# Patient Record
Sex: Male | Born: 1975 | Race: Black or African American | Hispanic: No | Marital: Married | Smoking: Current every day smoker
Health system: Southern US, Community
[De-identification: ages and names within clinical notes are randomized; demographics above are authoritative.]

## PROBLEM LIST (undated history)

## (undated) HISTORY — PX: KNEE SURGERY: SHX244

---

## 2003-03-27 ENCOUNTER — Emergency Department (HOSPITAL_COMMUNITY): Admission: AD | Admit: 2003-03-27 | Discharge: 2003-03-27 | Payer: Self-pay | Admitting: Family Medicine

## 2003-08-10 ENCOUNTER — Emergency Department (HOSPITAL_COMMUNITY): Admission: EM | Admit: 2003-08-10 | Discharge: 2003-08-10 | Payer: Self-pay | Admitting: Family Medicine

## 2004-08-08 ENCOUNTER — Emergency Department (HOSPITAL_COMMUNITY): Admission: EM | Admit: 2004-08-08 | Discharge: 2004-08-08 | Payer: Self-pay | Admitting: Emergency Medicine

## 2007-12-19 ENCOUNTER — Emergency Department (HOSPITAL_COMMUNITY): Admission: EM | Admit: 2007-12-19 | Discharge: 2007-12-19 | Payer: Self-pay | Admitting: Emergency Medicine

## 2010-10-30 LAB — CBC
HCT: 46.7
Hemoglobin: 14.8
MCHC: 31.6
MCV: 72.6 — ABNORMAL LOW
Platelets: 314
RBC: 6.44 — ABNORMAL HIGH
RDW: 14.7
WBC: 11.2 — ABNORMAL HIGH

## 2010-10-30 LAB — BASIC METABOLIC PANEL
Calcium: 8.4
Glucose, Bld: 146 — ABNORMAL HIGH
Sodium: 136

## 2010-10-30 LAB — URINE MICROSCOPIC-ADD ON

## 2010-10-30 LAB — DIFFERENTIAL
Basophils Absolute: 0
Basophils Relative: 0
Eosinophils Absolute: 0
Eosinophils Relative: 0
Lymphocytes Relative: 13
Lymphs Abs: 1.4
Monocytes Absolute: 0.6
Monocytes Relative: 6
Neutro Abs: 9.1 — ABNORMAL HIGH
Neutrophils Relative %: 82 — ABNORMAL HIGH

## 2010-10-30 LAB — URINALYSIS, ROUTINE W REFLEX MICROSCOPIC
Bilirubin Urine: NEGATIVE
Glucose, UA: NEGATIVE
Hgb urine dipstick: NEGATIVE
Ketones, ur: 15 — AB
Leukocytes, UA: NEGATIVE
Nitrite: NEGATIVE
Protein, ur: 30 — AB
Specific Gravity, Urine: 1.036 — ABNORMAL HIGH
Urobilinogen, UA: 1
pH: 8

## 2011-01-06 ENCOUNTER — Emergency Department (HOSPITAL_COMMUNITY)
Admission: EM | Admit: 2011-01-06 | Discharge: 2011-01-07 | Disposition: A | Discharge status: Home / Self Care | Payer: No Typology Code available for payment source | Attending: Emergency Medicine | Admitting: Emergency Medicine

## 2011-01-06 DIAGNOSIS — M542 Cervicalgia: Secondary | ICD-10-CM | POA: Insufficient documentation

## 2011-01-06 DIAGNOSIS — R509 Fever, unspecified: Secondary | ICD-10-CM | POA: Insufficient documentation

## 2011-01-06 DIAGNOSIS — S139XXA Sprain of joints and ligaments of unspecified parts of neck, initial encounter: Secondary | ICD-10-CM | POA: Insufficient documentation

## 2011-01-06 DIAGNOSIS — J45909 Unspecified asthma, uncomplicated: Secondary | ICD-10-CM | POA: Insufficient documentation

## 2011-01-06 DIAGNOSIS — M25519 Pain in unspecified shoulder: Secondary | ICD-10-CM | POA: Insufficient documentation

## 2011-01-06 DIAGNOSIS — T148XXA Other injury of unspecified body region, initial encounter: Secondary | ICD-10-CM

## 2011-01-06 DIAGNOSIS — S60219A Contusion of unspecified wrist, initial encounter: Secondary | ICD-10-CM | POA: Insufficient documentation

## 2011-01-06 DIAGNOSIS — M25539 Pain in unspecified wrist: Secondary | ICD-10-CM | POA: Insufficient documentation

## 2011-01-06 DIAGNOSIS — M25569 Pain in unspecified knee: Secondary | ICD-10-CM | POA: Insufficient documentation

## 2011-01-07 ENCOUNTER — Encounter: Payer: Self-pay | Admitting: Emergency Medicine

## 2011-01-07 MED ORDER — IBUPROFEN 800 MG PO TABS
800.0000 mg | ORAL_TABLET | Freq: Once | ORAL | Status: AC
  Administered 2011-01-07: 800 mg via ORAL
  Filled 2011-01-07: qty 1

## 2011-01-07 MED ORDER — DIAZEPAM 5 MG PO TABS: 5.0000 mg | ORAL_TABLET | Freq: Three times a day (TID) | ORAL | Status: AC | PRN

## 2011-01-07 MED ORDER — OXYCODONE-ACETAMINOPHEN 5-325 MG PO TABS
2.0000 | ORAL_TABLET | Freq: Once | ORAL | Status: AC
  Administered 2011-01-07: 2 via ORAL
  Filled 2011-01-07: qty 2

## 2011-01-07 MED ORDER — OXYCODONE-ACETAMINOPHEN 5-325 MG PO TABS: 2.0000 | ORAL_TABLET | ORAL | Status: AC | PRN

## 2011-01-07 MED ORDER — IBUPROFEN 800 MG PO TABS: 800.0000 mg | ORAL_TABLET | Freq: Three times a day (TID) | ORAL | Status: AC
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
## 2011-01-21 ENCOUNTER — Emergency Department (HOSPITAL_COMMUNITY): Payer: Self-pay

## 2011-01-21 ENCOUNTER — Encounter (HOSPITAL_COMMUNITY): Payer: Self-pay | Admitting: *Deleted

## 2011-01-21 ENCOUNTER — Emergency Department (HOSPITAL_COMMUNITY)
Admission: EM | Admit: 2011-01-21 | Discharge: 2011-01-21 | Disposition: A | Discharge status: Home / Self Care | Payer: Self-pay | Attending: Emergency Medicine | Admitting: Emergency Medicine

## 2011-01-21 DIAGNOSIS — J45909 Unspecified asthma, uncomplicated: Secondary | ICD-10-CM | POA: Insufficient documentation

## 2011-01-21 DIAGNOSIS — F17200 Nicotine dependence, unspecified, uncomplicated: Secondary | ICD-10-CM | POA: Insufficient documentation

## 2011-01-21 DIAGNOSIS — J3489 Other specified disorders of nose and nasal sinuses: Secondary | ICD-10-CM | POA: Insufficient documentation

## 2011-01-21 DIAGNOSIS — C759 Malignant neoplasm of endocrine gland, unspecified: Secondary | ICD-10-CM | POA: Insufficient documentation

## 2011-01-21 DIAGNOSIS — R0789 Other chest pain: Secondary | ICD-10-CM | POA: Insufficient documentation

## 2011-01-21 DIAGNOSIS — J40 Bronchitis, not specified as acute or chronic: Secondary | ICD-10-CM | POA: Insufficient documentation

## 2011-01-21 DIAGNOSIS — R0602 Shortness of breath: Secondary | ICD-10-CM | POA: Insufficient documentation

## 2011-01-21 DIAGNOSIS — R059 Cough, unspecified: Secondary | ICD-10-CM | POA: Insufficient documentation

## 2011-01-21 DIAGNOSIS — R6883 Chills (without fever): Secondary | ICD-10-CM | POA: Insufficient documentation

## 2011-01-21 DIAGNOSIS — IMO0001 Reserved for inherently not codable concepts without codable children: Secondary | ICD-10-CM | POA: Insufficient documentation

## 2011-01-21 DIAGNOSIS — R05 Cough: Secondary | ICD-10-CM | POA: Insufficient documentation

## 2011-01-21 DIAGNOSIS — R0989 Other specified symptoms and signs involving the circulatory and respiratory systems: Secondary | ICD-10-CM | POA: Insufficient documentation

## 2011-01-21 MED ORDER — ALBUTEROL SULFATE HFA 108 (90 BASE) MCG/ACT IN AERS: 1.0000 | INHALATION_SPRAY | Freq: Four times a day (QID) | RESPIRATORY_TRACT | Status: DC | PRN

## 2011-01-21 MED ORDER — AZITHROMYCIN 250 MG PO TABS: 250.0000 mg | ORAL_TABLET | Freq: Every day | ORAL | Status: AC

## 2011-01-21 NOTE — ED Provider Notes (Signed)
History     CSN: [id number]  Arrival date & time 01/21/11  1732   First MD Initiated Contact with Patient 01/21/11 1754      Chief Complaint  Patient presents with  . chest congestion     (Consider location/radiation/quality/duration/timing/severity/associated sxs/prior treatment) HPI Comments: Patient presents with cough, congestion, pain with coughing and shortness of breath since last night. He also has diffuse body aches and chills. No documented fevers. Denies any nausea, vomiting, abdominal pain. He negative flu shot. He is an active smoker.  The history is provided by the patient.    Past Medical History  Diagnosis Date  . Asthma     Past Surgical History  Procedure Date  . Knee surgery     No family history on file.  History  Substance Use Topics  . Smoking status: Current Everyday Smoker  . Smokeless tobacco: Not on file  . Alcohol Use: No      Review of Systems  Constitutional: Positive for fever. Negative for activity change and appetite change.  HENT: Positive for congestion and rhinorrhea. Negative for sore throat.   Eyes: Negative for visual disturbance.  Respiratory: Positive for cough, chest tightness and shortness of breath.   Cardiovascular: Negative for chest pain.  Gastrointestinal: Negative for nausea, vomiting and abdominal pain.  Genitourinary: Negative for dysuria and hematuria.  Musculoskeletal: Positive for myalgias and arthralgias. Negative for back pain.  Skin: Negative for rash.  Neurological: Negative for dizziness.    Allergies  Review of patient's allergies indicates no known allergies.  Home Medications   Current Outpatient Rx  Name Route Sig Dispense Refill  . PENICILLIN V POTASSIUM 500 MG PO TABS Oral Take 500 mg by mouth 4 (four) times daily.      . ALBUTEROL SULFATE HFA 108 (90 BASE) MCG/ACT IN AERS Inhalation Inhale 1-2 puffs into the lungs every 6 (six) hours as needed for wheezing. 1 Inhaler 0  . AZITHROMYCIN 250  MG PO TABS Oral Take 1 tablet (250 mg total) by mouth daily. Take first 2 tablets together, then 1 every day until finished. 6 tablet 0    BP 126/75  Pulse 98  Temp(Src) 98.5 F (36.9 C) (Oral)  SpO2 98%  Physical Exam  Constitutional: He is oriented to person, place, and time. He appears well-developed and well-nourished. No distress.  HENT:  Head: Normocephalic and atraumatic.  Mouth/Throat: Oropharynx is clear and moist. No oropharyngeal exudate.  Eyes: Conjunctivae are normal. Pupils are equal, round, and reactive to light.  Neck: Normal range of motion. Neck supple.       No meningismus  Cardiovascular: Normal rate, regular rhythm and normal heart sounds.   Pulmonary/Chest: Effort normal and breath sounds normal. No respiratory distress. He has no wheezes.  Abdominal: Soft. There is no tenderness. There is no rebound and no guarding.  Musculoskeletal: Normal range of motion. He exhibits no edema and no tenderness.  Neurological: He is alert and oriented to person, place, and time. No cranial nerve deficit.  Skin: Skin is warm.    ED Course  Procedures (including critical care time)  Labs Reviewed - No data to display Dg Chest 2 View  01/21/2011  *RADIOLOGY REPORT*  Clinical Data: cough, cold, congestion.  CHEST - 2 VIEW  Comparison: 12/19/2007  Findings: The lungs are clear without focal infiltrate, edema, pneumothorax or pleural effusion. Interstitial markings are diffusely coarsened with chronic features. Cardiopericardial silhouette is at upper limits of normal for size. Imaged bony structures of the  thorax are intact.  IMPRESSION: Chronic interstitial coarsening.  No acute findings.  Original Report Authenticated By: ERIC A. MANSELL, M.D.     1. Bronchitis       MDM  Cough, congestion, body aches and chills. No acute distress, vitals stable.  CXR negative. PAtient sleeping in triage.  NO distress.        Glynn Octave, MD 01/21/11 2114

## 2011-01-21 NOTE — ED Notes (Signed)
Patient with chest congestion since last night and states that he is coughing up mucous

## 2011-01-21 NOTE — ED Notes (Signed)
Onset: x 1 day. Took mom pcn. Got worse today.

## 2011-01-21 NOTE — ED Notes (Signed)
Took mom pcn last night.

## 2011-10-29 ENCOUNTER — Encounter (HOSPITAL_COMMUNITY): Payer: Self-pay | Admitting: Emergency Medicine

## 2011-10-29 DIAGNOSIS — F17200 Nicotine dependence, unspecified, uncomplicated: Secondary | ICD-10-CM | POA: Insufficient documentation

## 2011-10-29 DIAGNOSIS — J45909 Unspecified asthma, uncomplicated: Secondary | ICD-10-CM | POA: Insufficient documentation

## 2011-10-29 DIAGNOSIS — Z79899 Other long term (current) drug therapy: Secondary | ICD-10-CM | POA: Insufficient documentation

## 2011-10-29 DIAGNOSIS — J329 Chronic sinusitis, unspecified: Secondary | ICD-10-CM | POA: Insufficient documentation

## 2011-10-29 MED ORDER — ACETAMINOPHEN 325 MG PO TABS
650.0000 mg | ORAL_TABLET | Freq: Once | ORAL | Status: AC
  Administered 2011-10-30: 650 mg via ORAL
  Filled 2011-10-29: qty 2

## 2011-10-29 NOTE — ED Notes (Signed)
Reports congestion, headache, fever, phlegm in throat; reports has recurring sinus infections and all symptoms correlate with previous ones

## 2011-10-30 ENCOUNTER — Emergency Department (HOSPITAL_COMMUNITY)
Admission: EM | Admit: 2011-10-30 | Discharge: 2011-10-30 | Disposition: A | Discharge status: Home / Self Care | Payer: Self-pay | Attending: Emergency Medicine | Admitting: Emergency Medicine

## 2011-10-30 DIAGNOSIS — J329 Chronic sinusitis, unspecified: Secondary | ICD-10-CM

## 2011-10-30 DIAGNOSIS — J069 Acute upper respiratory infection, unspecified: Secondary | ICD-10-CM

## 2011-10-30 MED ORDER — AZITHROMYCIN 250 MG PO TABS
500.0000 mg | ORAL_TABLET | Freq: Once | ORAL | Status: AC
  Administered 2011-10-30: 500 mg via ORAL
  Filled 2011-10-30: qty 2

## 2011-10-30 MED ORDER — AZITHROMYCIN 250 MG PO TABS: 250.0000 mg | ORAL_TABLET | Freq: Every day | ORAL | Status: DC

## 2011-10-30 MED ORDER — IBUPROFEN 400 MG PO TABS
600.0000 mg | ORAL_TABLET | Freq: Once | ORAL | Status: AC
  Administered 2011-10-30: 600 mg via ORAL
  Filled 2011-10-30: qty 1

## 2011-10-30 NOTE — ED Provider Notes (Signed)
History     CSN: [id number]  Arrival date & time 10/29/11  2343   First MD Initiated Contact with Patient 10/30/11 0016      Chief Complaint  Patient presents with  . Nasal Congestion    (Consider location/radiation/quality/duration/timing/severity/associated sxs/prior treatment) HPI  36 year old male with history of recurrent sinus infection presents with fever.  Pt sts since yesterday he has had gradual onset of fever, chills, headache, nasal congesting, occasional sneeze, sore throat, phlegm in throat, and body aches.  Headache to front of face, throbbing, and persistent. Symptoms felt similar to sinus infection that he has every year when the weather changes.  He denies productive cough, chest pain, or sob.  Denies neck stiffness, n/v/d, abd pain, or rash.  Has tried OTC nasal spray without relief.  Has not tried any antipyretic.  Sts Zpak 5 days course usually helps.  Is a smoker (1-2 pack a week) and is trying to quit.    Past Medical History  Diagnosis Date  . Asthma     Past Surgical History  Procedure Date  . Knee surgery     History reviewed. No pertinent family history.  History  Substance Use Topics  . Smoking status: Current Every Day Smoker -- 0.5 packs/day    Types: Cigarettes  . Smokeless tobacco: Not on file  . Alcohol Use: No      Review of Systems  Constitutional: Positive for fever.  HENT: Positive for ear pain, congestion, sore throat, rhinorrhea, sneezing, postnasal drip and sinus pressure. Negative for drooling, trouble swallowing, neck pain, neck stiffness and voice change.   Respiratory: Negative for chest tightness and shortness of breath.   Cardiovascular: Negative for chest pain.  Gastrointestinal: Negative for abdominal pain.  Skin: Negative for rash.  Neurological: Negative for numbness.  All other systems reviewed and are negative.    Allergies  Review of patient's allergies indicates no known allergies.  Home Medications    Current Outpatient Rx  Name Route Sig Dispense Refill  . ALBUTEROL SULFATE HFA 108 (90 BASE) MCG/ACT IN AERS Inhalation Inhale 1-2 puffs into the lungs every 6 (six) hours as needed for wheezing. 1 Inhaler 0  . OVER THE COUNTER MEDICATION Oral Take 1 tablet by mouth daily.    Marland Kitchen SALINE NASAL SPRAY 0.65 % NA SOLN Nasal Place 1 spray into the nose as needed. For congestion      BP 129/88  Pulse 100  Temp 102.2 F (39 C) (Oral)  Resp 16  SpO2 98%  Physical Exam  Nursing note and vitals reviewed. Constitutional: He is oriented to person, place, and time. He appears well-developed and well-nourished. No distress.  HENT:  Head: Normocephalic and atraumatic.  Right Ear: External ear normal.  Left Ear: External ear normal.  Mouth/Throat: Uvula is midline, oropharynx is clear and moist and mucous membranes are normal. No oropharyngeal exudate.       Boggy turbinates with rhinorrhea  Mild tenderness to frontal and maxillary sinus on percussion.  No overlying skin changes.   Poor dentition  Eyes: Conjunctivae normal are normal.  Neck: Normal range of motion. Neck supple. No rigidity. No Brudzinski's sign and no Kernig's sign noted.  Cardiovascular: Normal rate.   Pulmonary/Chest: Effort normal and breath sounds normal. No respiratory distress. He has no wheezes.  Abdominal: Soft. There is no tenderness.  Lymphadenopathy:    He has cervical adenopathy.  Neurological: He is alert and oriented to person, place, and time.  Skin: Skin is warm. No  rash noted.  Psychiatric: He has a normal mood and affect.    ED Course  Procedures (including critical care time)  Labs Reviewed - No data to display No results found.   No diagnosis found.  1. sinusitis  MDM  Pt with hx of bronchitis and hx of sinus infection presents with same sxs.  It is febrile at 102.2.  Tylenol given.  Pt has no gross focal deficits and no nuchal rigidity.  No red flags.  Since Zpak works well in the past, i will  prescribe the same.  With onset of 2 days, it's likely viral, but abx offered to prevent superimposing bacterial infection.  Smoking cessation discussed.  Recommend strict return precaution if sxs not improve with antipyretic and abx in 3-4 days.     BP 129/88  Pulse 100  Temp 102.2 F (39 C) (Oral)  Resp 16  SpO2 98%  Nursing notes reviewed and considered in documentation  Previous records reviewed and considered  All labs/vitals reviewed and considered        Fayrene Helper, PA-C 10/30/11 0146  Fayrene Helper, PA-C 10/30/11 [id number]

## 2011-10-30 NOTE — ED Provider Notes (Signed)
Medical screening examination/treatment/procedure(s) were performed by non-physician practitioner and as supervising physician I was immediately available for consultation/collaboration.    Kirin Pastorino D Caylyn Tedeschi, MD 10/30/11 2316 

## 2012-07-23 ENCOUNTER — Encounter (HOSPITAL_COMMUNITY): Payer: Self-pay | Admitting: Adult Health

## 2012-07-23 ENCOUNTER — Emergency Department (HOSPITAL_COMMUNITY)
Admission: EM | Admit: 2012-07-23 | Discharge: 2012-07-23 | Disposition: A | Discharge status: Home / Self Care | Payer: Self-pay | Attending: Emergency Medicine | Admitting: Emergency Medicine

## 2012-07-23 DIAGNOSIS — K642 Third degree hemorrhoids: Secondary | ICD-10-CM

## 2012-07-23 DIAGNOSIS — J45909 Unspecified asthma, uncomplicated: Secondary | ICD-10-CM | POA: Insufficient documentation

## 2012-07-23 DIAGNOSIS — F17200 Nicotine dependence, unspecified, uncomplicated: Secondary | ICD-10-CM | POA: Insufficient documentation

## 2012-07-23 DIAGNOSIS — K648 Other hemorrhoids: Secondary | ICD-10-CM | POA: Insufficient documentation

## 2012-07-23 MED ORDER — HYDROMORPHONE HCL PF 1 MG/ML IJ SOLN
1.0000 mg | Freq: Once | INTRAMUSCULAR | Status: AC
  Administered 2012-07-23: 1 mg via INTRAVENOUS
  Filled 2012-07-23: qty 1

## 2012-07-23 MED ORDER — LIDOCAINE HCL 2 % EX GEL
Freq: Once | CUTANEOUS | Status: AC
  Administered 2012-07-23: 20
  Filled 2012-07-23: qty 20

## 2012-07-23 MED ORDER — LIDOCAINE (ANORECTAL) 5 % EX GEL: 15.0000 mL | CUTANEOUS | Status: DC | PRN

## 2012-07-23 MED ORDER — TRAMADOL HCL 50 MG PO TABS: 50.0000 mg | ORAL_TABLET | Freq: Four times a day (QID) | ORAL | Status: DC | PRN

## 2012-07-23 MED ORDER — ONDANSETRON HCL 4 MG/2ML IJ SOLN
4.0000 mg | Freq: Once | INTRAMUSCULAR | Status: AC
  Administered 2012-07-23: 4 mg via INTRAVENOUS
  Filled 2012-07-23: qty 2

## 2012-07-23 MED ORDER — HYDROMORPHONE HCL PF 1 MG/ML IJ SOLN
0.5000 mg | Freq: Once | INTRAMUSCULAR | Status: AC
  Administered 2012-07-23: 0.5 mg via INTRAVENOUS
  Filled 2012-07-23: qty 1

## 2012-07-23 NOTE — ED Notes (Signed)
Reports hemorrhoid pain that feels, "like a big knot"  BM this evening with some mild bleeding from the hemorrhoids. Reports they external and very painful.

## 2012-07-23 NOTE — ED Notes (Signed)
No new changes from triage assessment 

## 2012-07-23 NOTE — ED Provider Notes (Signed)
History    CSN: [id number] Arrival date & time 07/23/12  0114  First MD Initiated Contact with Patient 07/23/12 0145     Chief Complaint  Patient presents with  . Hemorrhoids   (Consider location/radiation/quality/duration/timing/severity/associated sxs/prior Treatment) HPI  Derek Bush is a 37 y.o. male complaining of hemorrhoid pain worsening over the course last 2 days. Patient states they are external, quite large but reducible. Pain is severe, 10 out of 10, exacerbated by walking and palpation. Patient denies fever, nausea vomiting.   Past Medical History  Diagnosis Date  . Asthma    Past Surgical History  Procedure Laterality Date  . Knee surgery     History reviewed. No pertinent family history. History  Substance Use Topics  . Smoking status: Current Every Day Smoker -- 0.50 packs/day    Types: Cigarettes  . Smokeless tobacco: Not on file  . Alcohol Use: No    Review of Systems  Constitutional:       Negative except as described in HPI  HENT:       Negative except as described in HPI  Respiratory:       Negative except as described in HPI  Cardiovascular:       Negative except as described in HPI  Gastrointestinal:       Negative except as described in HPI  Genitourinary:       Negative except as described in HPI  Musculoskeletal:       Negative except as described in HPI  Skin:       Negative except as described in HPI  Neurological:       Negative except as described in HPI  All other systems reviewed and are negative.    Allergies  Review of patient's allergies indicates no known allergies.  Home Medications  No current outpatient prescriptions on file. BP 142/98  Temp(Src) 98.1 F (36.7 C) (Oral)  Resp 18  SpO2 96% Physical Exam  Nursing note and vitals reviewed. Constitutional: He is oriented to person, place, and time. He appears well-developed and well-nourished. No distress.  Appears uncomfortable.   HENT:  Head: Normocephalic.   Eyes: Conjunctivae and EOM are normal.  Cardiovascular: Normal rate.   Pulmonary/Chest: Effort normal. No stridor.  Genitourinary:  Rectal exam chaperoned by nurse:  2 x 3 cm prolapsed internal hemorrhoid. It is reducible however it immediately prolapses.  Musculoskeletal: Normal range of motion.  Neurological: He is alert and oriented to person, place, and time.  Psychiatric: He has a normal mood and affect.    ED Course  Procedures (including critical care time) Labs Reviewed - No data to display No results found. 1. Prolapsed internal hemorrhoids, grade 3     MDM   Filed Vitals:   07/23/12 0118 07/23/12 0451  BP: 142/98 140/76  Pulse:  70  Temp: 98.1 F (36.7 C) 98 F (36.7 C)  TempSrc: Oral Oral  Resp: 18 18  SpO2: 96% 98%     Derek Bush is a 37 y.o. male male with grade 3 hemorrhoids. No systemic symptoms. Recommend outpatient surgical evaluation. Patient given% lidocaine gel, tramadol for pain control and discussed constipation precautions.   Discussed case with attending who agrees with plan and stability to d/c to home.    Medications  lidocaine (XYLOCAINE) 2 % jelly (20 application Other Given 07/23/12 0256)  HYDROmorphone (DILAUDID) injection 0.5 mg (0.5 mg Intravenous Given 07/23/12 0251)  ondansetron (ZOFRAN) injection 4 mg (4 mg Intravenous Given 07/23/12 0253)  HYDROmorphone (DILAUDID) injection 1 mg (1 mg Intravenous Given 07/23/12 0350)    Pt is hemodynamically stable, appropriate for, and amenable to discharge at this time. Pt verbalized understanding and agrees with care plan. Outpatient follow-up and specific return precautions discussed.     Wynetta Emery, PA-C 07/23/12 [phone]

## 2012-07-23 NOTE — ED Provider Notes (Signed)
Medical screening examination/treatment/procedure(s) were performed by non-physician practitioner and as supervising physician I was immediately available for consultation/collaboration.  Sunnie Nielsen, MD 07/23/12 [phone]

## 2012-07-25 ENCOUNTER — Encounter (HOSPITAL_COMMUNITY): Payer: Self-pay | Admitting: *Deleted

## 2012-07-25 ENCOUNTER — Emergency Department (HOSPITAL_COMMUNITY)
Admission: EM | Admit: 2012-07-25 | Discharge: 2012-07-25 | Disposition: A | Discharge status: Home / Self Care | Payer: Self-pay | Attending: Emergency Medicine | Admitting: Emergency Medicine

## 2012-07-25 DIAGNOSIS — K648 Other hemorrhoids: Secondary | ICD-10-CM | POA: Insufficient documentation

## 2012-07-25 DIAGNOSIS — K642 Third degree hemorrhoids: Secondary | ICD-10-CM

## 2012-07-25 DIAGNOSIS — J45909 Unspecified asthma, uncomplicated: Secondary | ICD-10-CM | POA: Insufficient documentation

## 2012-07-25 DIAGNOSIS — F17200 Nicotine dependence, unspecified, uncomplicated: Secondary | ICD-10-CM | POA: Insufficient documentation

## 2012-07-25 DIAGNOSIS — K6289 Other specified diseases of anus and rectum: Secondary | ICD-10-CM | POA: Insufficient documentation

## 2012-07-25 DIAGNOSIS — K921 Melena: Secondary | ICD-10-CM | POA: Insufficient documentation

## 2012-07-25 LAB — CBC
MCHC: 32.1 g/dL (ref 30.0–36.0)
MCV: 71.8 fL — ABNORMAL LOW (ref 78.0–100.0)
RBC: 6.17 MIL/uL — ABNORMAL HIGH (ref 4.22–5.81)
RDW: 14.3 % (ref 11.5–15.5)

## 2012-07-25 MED ORDER — DOCUSATE SODIUM 100 MG PO CAPS: 100.0000 mg | ORAL_CAPSULE | Freq: Two times a day (BID) | ORAL | Status: AC

## 2012-07-25 MED ORDER — LIDOCAINE (ANORECTAL) 5 % EX GEL: 15.0000 mL | CUTANEOUS | Status: AC | PRN

## 2012-07-25 NOTE — ED Provider Notes (Signed)
History    CSN: [id number] Arrival date & time 07/25/12  1303  First MD Initiated Contact with Patient 07/25/12 1334     Chief Complaint  Patient presents with  . Rectal Bleeding   (Consider location/radiation/quality/duration/timing/severity/associated sxs/prior Treatment) HPI Comments: Patient is a 37 year old male with a history of asthma, recently diagnosed with grade 3 internal hemorrhoid 2 days ago, who presents for rectal bleeding x3 days. Patient presents today as he states that his rectal bleeding has worsened since he was last seen. Patient states that he has had spotting on his drawers and through to his pants; blood is "medium red" in color and without clots. Patient was given topical lidocaine gel which helps his rectal pain; states that only really painful when having BM. Last BM was today which was normal in color and consistency; not constipated. Patient denies fevers, shortness of breath, abdominal pain, back pain, urinary symptoms, nausea or vomiting, and numbness and tingling in his extremities.  Patient is a 37 y.o. male presenting with hematochezia. The history is provided by the patient. No language interpreter was used.  Rectal Bleeding Associated symptoms: no abdominal pain, no fever and no vomiting    Past Medical History  Diagnosis Date  . Asthma    Past Surgical History  Procedure Laterality Date  . Knee surgery     History reviewed. No pertinent family history. History  Substance Use Topics  . Smoking status: Current Every Day Smoker -- 0.50 packs/day    Types: Cigarettes  . Smokeless tobacco: Not on file  . Alcohol Use: No    Review of Systems  Constitutional: Negative for fever.  Respiratory: Negative for shortness of breath.   Gastrointestinal: Positive for blood in stool (BRBPR), hematochezia and rectal pain. Negative for nausea, vomiting, abdominal pain and constipation.  Genitourinary: Negative.  Negative for dysuria and hematuria.   Musculoskeletal: Negative for back pain.  Skin: Negative for pallor and rash.  Neurological: Negative for weakness and numbness.  All other systems reviewed and are negative.   Allergies  Review of patient's allergies indicates no known allergies.  Home Medications   Current Outpatient Rx  Name  Route  Sig  Dispense  Refill  . docusate sodium (COLACE) 100 MG capsule   Oral   Take 1 capsule (100 mg total) by mouth every 12 (twelve) hours.   30 capsule   0   . Lidocaine, Anorectal, 5 % GEL   Topical   Apply 15 mLs topically every 4 (four) hours as needed (pain).   10 g   0    BP 133/81  Pulse 87  Temp(Src) 99.1 F (37.3 C) (Oral)  Resp 18  SpO2 98%  Physical Exam  Nursing note and vitals reviewed. Constitutional: He is oriented to person, place, and time. He appears well-developed and well-nourished. No distress.  HENT:  Head: Normocephalic and atraumatic.  Mouth/Throat: Oropharynx is clear and moist. No oropharyngeal exudate.  Eyes: Conjunctivae and EOM are normal. Pupils are equal, round, and reactive to light. No scleral icterus.  Neck: Normal range of motion. Neck supple.  Cardiovascular: Normal rate, regular rhythm, normal heart sounds and intact distal pulses.   Pulmonary/Chest: Effort normal and breath sounds normal. No respiratory distress. He has no wheezes. He has no rales.  Abdominal: Soft. He exhibits no distension and no mass. There is no tenderness. There is no rebound and no guarding.  Genitourinary: Rectal exam shows internal hemorrhoid and tenderness. Rectal exam shows no fissure and anal  tone normal.  Chaperoned GU exam with bleeding grade 3 internal hemorrhoid approximately 2cm in diameter; firm with area of bluish discoloration 0.5cm in diameter c/w potential thrombosed hemorrhoid. Hemorrhoid TTP. No surrounding soft tissue swelling, erythema, or heat-to-touch to suspect infection. Rectal tone normal.  Musculoskeletal: Normal range of motion.   Lymphadenopathy:    He has no cervical adenopathy.  Neurological: He is alert and oriented to person, place, and time.  Skin: Skin is warm. No rash noted. He is not diaphoretic. No erythema. No pallor.  Psychiatric: He has a normal mood and affect. His behavior is normal.    ED Course  Procedures (including critical care time) Labs Reviewed  CBC - Abnormal; Notable for the following:    RBC 6.17 (*)    MCV 71.8 (*)    MCH 23.0 (*)    All other components within normal limits   No results found.  1. Prolapsed internal hemorrhoids, grade 3     MDM  Prolapsed internal hemorrhoids, grade 3 - Possible partial thrombosis appreciated on exam; TTP of hemorrhoids and rectum appropriate during physical exam. Patient hemodynamically stable without tachycardia or hypotension. H/H stable. No fever or erythema, soft tissue swelling, or heat-to-touch to suspect underlying complicating/infectious process. Patient has been given general surgery referral and has yet to follow up. Patient urged to pursue follow up for surgical evaluation. Rx for lidocaine gel and colace given for symptoms; sitz baths recommended. Indications for ED return discussed with patient who verbalizes comfort and understanding with plan. Patient appropriate for d/c.  Filed Vitals:   07/25/12 1318 07/25/12 1326  BP: 158/96 133/81  Pulse: 96 87  Temp: 98.3 F (36.8 C) 99.1 F (37.3 C)  TempSrc: Oral Oral  Resp: 20 18  SpO2: 97% 98%       Antony Madura, PA-C 07/25/12 2033

## 2012-07-25 NOTE — ED Notes (Signed)
Reports being seen on Friday for rectal bleeding and was told it was hemorrhoids, reports having cont rectal bleeding since then. No acute distress noted at triage.

## 2012-07-25 NOTE — ED Provider Notes (Signed)
Medical screening examination/treatment/procedure(s) were performed by non-physician practitioner and as supervising physician I was immediately available for consultation/collaboration.  Doug Sou, MD 07/25/12 2053

## 2012-07-26 ENCOUNTER — Telehealth (INDEPENDENT_AMBULATORY_CARE_PROVIDER_SITE_OTHER): Payer: Self-pay | Admitting: General Surgery

## 2012-07-26 NOTE — Telephone Encounter (Signed)
Patient calling - he has set up an appt for a couple weeks from now to evaluate his hemorrhoids. He is asking the best way to stop his hemorrhoidal bleeding. He has not done anything for these. I advised him to do warm tub soaks 3-4 times a day. He states he is having dark red bleeding now. I advised if he has bright red bleeding that does not stop after warm tub soaks he may need to be evaluated in the emergency room. He will call if needed and keep appt.

## 2012-08-06 ENCOUNTER — Ambulatory Visit (INDEPENDENT_AMBULATORY_CARE_PROVIDER_SITE_OTHER): Payer: Self-pay | Admitting: General Surgery

## 2014-05-11 ENCOUNTER — Ambulatory Visit
Admit: 2014-05-11 | Disposition: A | Discharge status: Home / Self Care | Payer: Self-pay | Attending: Internal Medicine | Admitting: Internal Medicine

## 2014-12-06 ENCOUNTER — Emergency Department (HOSPITAL_COMMUNITY): Payer: Self-pay

## 2014-12-06 ENCOUNTER — Encounter (HOSPITAL_COMMUNITY): Payer: Self-pay | Admitting: Family Medicine

## 2014-12-06 ENCOUNTER — Emergency Department (HOSPITAL_COMMUNITY)
Admission: EM | Admit: 2014-12-06 | Discharge: 2014-12-06 | Disposition: A | Discharge status: Home / Self Care | Payer: Self-pay | Attending: Physician Assistant | Admitting: Physician Assistant

## 2014-12-06 DIAGNOSIS — Z79899 Other long term (current) drug therapy: Secondary | ICD-10-CM | POA: Insufficient documentation

## 2014-12-06 DIAGNOSIS — R531 Weakness: Secondary | ICD-10-CM | POA: Insufficient documentation

## 2014-12-06 DIAGNOSIS — J069 Acute upper respiratory infection, unspecified: Secondary | ICD-10-CM | POA: Insufficient documentation

## 2014-12-06 DIAGNOSIS — Z720 Tobacco use: Secondary | ICD-10-CM | POA: Insufficient documentation

## 2014-12-06 DIAGNOSIS — J45901 Unspecified asthma with (acute) exacerbation: Secondary | ICD-10-CM | POA: Insufficient documentation

## 2014-12-06 MED ORDER — BENZONATATE 100 MG PO CAPS: 100.0000 mg | ORAL_CAPSULE | Freq: Three times a day (TID) | ORAL | Status: DC | PRN

## 2014-12-06 MED ORDER — ACETAMINOPHEN 325 MG PO TABS
650.0000 mg | ORAL_TABLET | Freq: Once | ORAL | Status: AC
  Administered 2014-12-06: 650 mg via ORAL
  Filled 2014-12-06: qty 2

## 2014-12-06 MED ORDER — ACETAMINOPHEN 500 MG PO TABS: 500.0000 mg | ORAL_TABLET | Freq: Four times a day (QID) | ORAL | Status: AC | PRN

## 2014-12-06 NOTE — ED Provider Notes (Signed)
CSN: [id number]     Arrival date & time 12/06/14  1660 History  By signing my name below, I, Derek Bush, attest that this documentation has been prepared under the direction and in the presence of Derek Loan, PA-C. Electronically Signed: Eustaquio Bush, ED Scribe. 12/06/2014. 10:37 AM.  Chief Complaint  Patient presents with  . Nasal Congestion  . Cough   The history is provided by the patient. No language interpreter was used.     HPI Comments: Derek Bush is a 39 y.o. male with hx asthma who presents to the Emergency Department complaining of gradual onset, constant, chest congestion, generalized weakness, and hiccups x 1 day, gradually worsening. He also complains of productive cough and shortness of breath that began this morning. Pt has hx of sinus infections and states his symptoms feel similar. He has not taken anything for his symptoms. There are no modifying factors. Denies rhinorrhea, sore throat, neck pain, neck stiffness, sinus pressure, headache, ear pain, watery eyes, myalgias, or any other associated symptoms. Pt is current every day smoker who smokes about 1 pack every 2-3 days.     Past Medical History  Diagnosis Date  . Asthma    Past Surgical History  Procedure Laterality Date  . Knee surgery     History reviewed. No pertinent family history. Social History  Substance Use Topics  . Smoking status: Current Every Day Smoker -- 0.50 packs/day    Types: Cigarettes  . Smokeless tobacco: None  . Alcohol Use: No    Review of Systems  All other systems reviewed and are negative.   Allergies  Review of patient's allergies indicates no known allergies.  Home Medications   Prior to Admission medications   Medication Sig Start Date End Date Taking? Authorizing Provider  acetaminophen (TYLENOL) 500 MG tablet Take 1 tablet (500 mg total) by mouth every 6 (six) hours as needed. 12/06/14   Derek Lepp, PA-C  benzonatate (TESSALON) 100 MG capsule Take 1 capsule  (100 mg total) by mouth 3 (three) times daily as needed for cough. 12/06/14   Derek Loan, PA-C  docusate sodium (COLACE) 100 MG capsule Take 1 capsule (100 mg total) by mouth every 12 (twelve) hours. 07/25/12   Derek Breach, PA-C  Lidocaine, Anorectal, 5 % GEL Apply 15 mLs topically every 4 (four) hours as needed (pain). 07/25/12   Derek Breach, PA-C   Triage Vitals: BP 135/70 mmHg  Pulse 65  Temp(Src) 98.6 F (37 C) (Oral)  Resp 20  SpO2 94%   Physical Exam  Constitutional: He is oriented to person, place, and time. He appears well-developed and well-nourished. No distress.  HENT:  Head: Normocephalic and atraumatic.  Right Ear: Tympanic membrane normal.  Left Ear: Tympanic membrane normal.  Nose: Nose normal. No rhinorrhea.  Mouth/Throat: Uvula is midline and oropharynx is clear and moist. No trismus in the jaw. No oropharyngeal exudate, posterior oropharyngeal edema, posterior oropharyngeal erythema or tonsillar abscesses.  No frontal or maxillary sinus tenderness.  Eyes: Conjunctivae and EOM are normal.  Neck: Neck supple. No tracheal deviation present.  No nuchal rigidity.  Cardiovascular: Normal rate, regular rhythm and normal heart sounds.   Pulmonary/Chest: Effort normal and breath sounds normal. No respiratory distress. He has no wheezes. He has no rales.  Abdominal: Soft. Bowel sounds are normal. He exhibits no distension.  Musculoskeletal: Normal range of motion.  Neurological: He is alert and oriented to person, place, and time.  Skin: Skin is warm and dry.  Psychiatric:  He has a normal mood and affect. His behavior is normal.  Nursing note and vitals reviewed.   ED Course  Procedures (including critical care time)  DIAGNOSTIC STUDIES: Oxygen Saturation is 94% on RA, adequate by my interpretation.    COORDINATION OF CARE: 10:36 AM-Discussed treatment plan which includes Tylenol and CXR with pt at bedside and pt agreed to plan.   Labs Review Labs Reviewed - No data to  display  Imaging Review Dg Chest 2 View  12/06/2014  CLINICAL DATA:  Productive cough. EXAM: CHEST  2 VIEW COMPARISON:  01/21/2011. FINDINGS: Mediastinum and hilar structures normal. Lungs are clear of acute infiltrate. Mild chronic prominence of the interstitium. Heart size normal. No pleural effusion or pneumothorax. IMPRESSION: No acute cardiopulmonary disease. Electronically Signed   By: Derek Bush  Register   On: 12/06/2014 11:32   I have personally reviewed and evaluated these images as part of my medical decision-making.   EKG Interpretation None      MDM   Final diagnoses:  URI (upper respiratory infection)   Patient presents with cough and sore throat since yesterday.  VSS, NAD.  Heart RRR, lungs CTAB, abdomen soft and benign.  HEENT exam unremarkable.  Centor criteria 0.  Doubt strep pharyngitis.  Doubt PNA.  Doubt meningitis.  WIll obtain CXR.  Will give tyelenol.  Suspect URI.  Evaluation does not show pathology requring ongoing emergent intervention or admission. Pt is hemodynamically stable and mentating appropriately. Discussed findings/results and plan with patient/guardian, who agrees with plan. All questions answered. Return precautions discussed and outpatient follow up given.    I personally performed the services described in this documentation, which was scribed in my presence. The recorded information has been reviewed and is accurate.      Derek Loan, PA-C 12/06/14 Derek Bush 12/06/14 1523

## 2014-12-06 NOTE — Discharge Instructions (Signed)
Upper Respiratory Infection, Adult Most upper respiratory infections (URIs) are a viral infection of the air passages leading to the lungs. A URI affects the nose, throat, and upper air passages. The most common type of URI is nasopharyngitis and is typically referred to as "the common cold." URIs run their course and usually go away on their own. Most of the time, a URI does not require medical attention, but sometimes a bacterial infection in the upper airways can follow a viral infection. This is called a secondary infection. Sinus and middle ear infections are common types of secondary upper respiratory infections. Bacterial pneumonia can also complicate a URI. A URI can worsen asthma and chronic obstructive pulmonary disease (COPD). Sometimes, these complications can require emergency medical care and may be life threatening.  CAUSES Almost all URIs are caused by viruses. A virus is a type of germ and can spread from one person to another.  RISKS FACTORS You may be at risk for a URI if:   You smoke.   You have chronic heart or lung disease.  You have a weakened defense (immune) system.   You are very young or very old.   You have nasal allergies or asthma.  You work in crowded or poorly ventilated areas.  You work in health care facilities or schools. SIGNS AND SYMPTOMS  Symptoms typically develop 2-3 days after you come in contact with a cold virus. Most viral URIs last 7-10 days. However, viral URIs from the influenza virus (flu virus) can last 14-18 days and are typically more severe. Symptoms may include:   Runny or stuffy (congested) nose.   Sneezing.   Cough.   Sore throat.   Headache.   Fatigue.   Fever.   Loss of appetite.   Pain in your forehead, behind your eyes, and over your cheekbones (sinus pain).  Muscle aches.  DIAGNOSIS  Your health care provider may diagnose a URI by:  Physical exam.  Tests to check that your symptoms are not due to  another condition such as:  Strep throat.  Sinusitis.  Pneumonia.  Asthma. TREATMENT  A URI goes away on its own with time. It cannot be cured with medicines, but medicines may be prescribed or recommended to relieve symptoms. Medicines may help:  Reduce your fever.  Reduce your cough.  Relieve nasal congestion. HOME CARE INSTRUCTIONS   Take medicines only as directed by your health care provider.   Gargle warm saltwater or take cough drops to comfort your throat as directed by your health care provider.  Use a warm mist humidifier or inhale steam from a shower to increase air moisture. This may make it easier to breathe.  Drink enough fluid to keep your urine clear or pale yellow.   Eat soups and other clear broths and maintain good nutrition.   Rest as needed.   Return to work when your temperature has returned to normal or as your health care provider advises. You may need to stay home longer to avoid infecting others. You can also use a face mask and careful hand washing to prevent spread of the virus.  Increase the usage of your inhaler if you have asthma.   Do not use any tobacco products, including cigarettes, chewing tobacco, or electronic cigarettes. If you need help quitting, ask your health care provider. PREVENTION  The best way to protect yourself from getting a cold is to practice good hygiene.   Avoid oral or hand contact with people with cold  symptoms.   Wash your hands often if contact occurs.  There is no clear evidence that vitamin C, vitamin E, echinacea, or exercise reduces the chance of developing a cold. However, it is always recommended to get plenty of rest, exercise, and practice good nutrition.  SEEK MEDICAL CARE IF:   You are getting worse rather than better.   Your symptoms are not controlled by medicine.   You have chills.  You have worsening shortness of breath.  You have brown or red mucus.  You have yellow or brown nasal  discharge.  You have pain in your face, especially when you bend forward.  You have a fever.  You have swollen neck glands.  You have pain while swallowing.  You have white areas in the back of your throat. SEEK IMMEDIATE MEDICAL CARE IF:   You have severe or persistent:  Headache.  Ear pain.  Sinus pain.  Chest pain.  You have chronic lung disease and any of the following:  Wheezing.  Prolonged cough.  Coughing up blood.  A change in your usual mucus.  You have a stiff neck.  You have changes in your:  Vision.  Hearing.  Thinking.  Mood. MAKE SURE YOU:   Understand these instructions.  Will watch your condition.  Will get help right away if you are not doing well or get worse.   This information is not intended to replace advice given to you by your health care provider. Make sure you discuss any questions you have with your health care provider.   Document Released: 07/09/2000 Document Revised: 05/30/2014 Document Reviewed: 04/20/2013 Elsevier Interactive Patient Education 2016 [employer].   Emergency Department Resource Guide 1) Find a Doctor and Pay Out of Pocket Although you won't have to find out who is covered by your insurance plan, it is a good idea to ask around and get recommendations. You will then need to call the office and see if the doctor you have chosen will accept you as a new patient and what types of options they offer for patients who are self-pay. Some doctors offer discounts or will set up payment plans for their patients who do not have insurance, but you will need to ask so you aren't surprised when you get to your appointment.  2) Contact Your Local Health Department Not all health departments have doctors that can see patients for sick visits, but many do, so it is worth a call to see if yours does. If you don't know where your local health department is, you can check in your phone book. The CDC also has a tool to help you  locate your state's health department, and many state websites also have listings of all of their local health departments.  3) Find a [facility] If your illness is not likely to be very severe or complicated, you may want to try a walk in clinic. These are popping up all over the country in pharmacies, drugstores, and shopping centers. They're usually staffed by nurse practitioners or physician assistants that have been trained to treat common illnesses and complaints. They're usually fairly quick and inexpensive. However, if you have serious medical issues or chronic medical problems, these are probably not your best option.  No Primary Care Doctor: - Call Health Connect at  [phone] - they can help you locate a primary care doctor that  accepts your insurance, provides certain services, etc. - Physician Referral Service- [phone]  Chronic Pain Problems: Organization  Address  Phone   Notes  [facility]  [phone] Patients need to be referred by their primary care doctor.   Medication Assistance: Organization         Address  Phone   Notes  [facility] Medication [facility] [address] [place], [place] [address] [phone] --Must be a resident of [facility] -- Must have NO insurance coverage whatsoever (no Medicaid/ Medicare, etc.) -- The pt. MUST have a primary care doctor that directs their care regularly and follows them in the community   Med[facility]ist  [phone]   [employer]  [phone]    Agencies that provide inexpensive medical care: Organization         Address  Phone   Notes  [place]  [phone]   [facility]    [phone]   [facility] [place], [place] [address] [phone]   [place] [address], [place] [phone]   Planned Parenthood    [phone]   [facility]    [phone]   [place] and [place] [address], [place] Phone:  [phone], Fax:  [phone] Hours of Operation:  9 am - 6 pm, M-F.  Also accepts Medicaid/Medicare and self-pay.  [facility] for [place], [address], [place] Phone: [phone], Fax: [phone]. Hours of Operation:  8:30 am - 5:30 pm, M-F.  Also accepts Medicaid and self-pay.  [facility] [place] [address], [place] Phone: [phone]   [place], [place], [place] [phone], Ext. [phone] Mondays & Thursdays: 7-9 AM.  First 15 patients are seen on a first come, first serve basis.    [place] Providers:  Organization         Address  Phone   Notes  [facility] [address], [place] [phone] Also accepts self-pay patients.  [facility] [address], [place]  [phone]   [place], [address], [place] [phone]   [facility] [address], [place] [phone]   Lucianne Lei [address], [place]   [phone] Only accepts [place] Access [place] patients after they have their name applied to their card.   Self-Pay (no insurance) in [facility]:  Organization         Address  Phone   Notes  Sickle Cell Patients, [facility] [place] [phone]   [facility] [place] [phone]   [facility] [place]  [place], [place], [place] [phone]   [facility]/Dr. Osei-Bonsu  [address], [place] or [address], [place] [phone] Phone number for both [place] and [place] locations is the same.  Urgent Medical and [facility] [address],  [place] [phone]   [facility] [address], [place] or [address] [phone]   [facility] [address], [place] [phone], phone; [phone], fax Sees patients 1st and 3rd Saturday of every month.  Must not qualify  for public or private insurance (i.e. Medicaid, Medicare, [place] Health Choice, Veterans' Benefits)  Household income should be no more than 200% of the poverty level The clinic cannot treat you if you are pregnant or think you are pregnant  Sexually transmitted diseases are not treated at the clinic.    Dental Care: Organization         Address  Phone  Notes  [facility] Department of [facility] [place] [phone] Accepts children up to age 69 who are enrolled in [place] or [place]; pregnant women with a Medicaid card; and children who have applied for Medicaid or [place] Health Choice, but were declined, whose parents can pay a reduced fee at time of service.  [facility] Department of [facility]  [address], [place] [phone] Accepts children up to age 60 who are enrolled in [place] or [place]; pregnant women with a Medicaid card; and children who have applied for Medicaid or [place] Health Choice, but were declined, whose parents can pay a reduced fee at time of service.  [place] Adult Dental Access PROGRAM  [place] [phone] Patients are seen by appointment only. Walk-ins are not accepted. [place] will see patients 30 years of age and older. Monday - Tuesday (8am-5pm) Most Wednesdays (8:30-5pm) $30 per visit, cash only  [facility] Adult Dental Access PROGRAM  [address], [facility] [phone] Patients are seen by appointment only. Walk-ins are not accepted. [place] will see patients 24 years of age and older. One Wednesday Evening  (Monthly: Volunteer Based).  $30 per visit, cash only  [place]  [phone] for adults; Children under age 50, call Graduate Pediatric Dentistry at [phone]. Children aged 72-14, please call [phone] to request a pediatric application.  Dental services are provided in all areas of dental care including fillings, crowns and bridges, complete and partial dentures, implants, gum treatment, root canals, and extractions. Preventive care is also provided. Treatment is provided to both adults and children. Patients are selected via a lottery and there is often a waiting list.   [facility] [address] [place]  [phone] [email or web address]   [facility] [address] [place], [place] [phone], Ext. [phone] Second and Fourth Thursday of each month, opens at 6:30 AM; Clinic ends at 9 AM.  Patients are seen on a first-come first-served basis, and a limited number are seen during each clinic.   [facility]  [address] Hillard Danker [place], [place] [phone]   Eligibility Requirements You must have lived in [place], [place], or [place] counties for at least the last three months.   You cannot be eligible for state or federal sponsored [employer], including [employer], [place], or [employer].   You generally cannot be eligible for healthcare insurance through your employer.    How to apply: Eligibility screenings are held every Tuesday and Wednesday afternoon from 1:00 pm until 4:00 pm. You do not need an appointment for the interview!  [facility] [address] [place], [place]   [place]  [place] Department  [place]  [phone]    Behavioral Health Resources in the Community: Intensive Outpatient Programs Organization         Address  Phone  Notes  [facility] [address], [place], [place] [phone]   [facility] Outpatient [address] [place], [place]   ADS: Alcohol & Drug Svcs [address] [place], [place]   [place] [address]  [place], [place] or [phone]   Substance Abuse Resources Organization         Address  Phone  Notes  Alcohol and Drug Services  [phone]   [place]  [phone]   [facility] [place]   Chinita Pester  [phone]   Residential & Outpatient Substance Abuse Program  [phone]   Psychological Services Organization         Address  Phone  Notes  [facility]  [place]  [phone]   [place] [address], [place] or [phone]    Mobile Crisis Teams Organization         Address  Phone  Notes  [facility], Mobile Crisis Care Unit  [phone]   [facility]  [address] [place], [place]   [place] Levels [address], [place] [phone]    Self-Help/Support Groups Organization         Address  Phone             Notes  [place]. of [place] - variety of support groups  [place]us Call for more information  Narcotics Anonymous (NA), [facility] [address], [employer] [place]  2 meetings at this location   Special educational needs teacher         Address  Phone  Notes  [facility] Residential Treatment [place],    [place]  [phone]   [facility]  [address], [place] [address], [place], [place]   Saxon Conrad, [place] [phone] Admissions: 8am-3pm M-F  Incentives Substance [place] [address],    [place], [place] [phone]   [facility] [address] [place],  [place], [place]   [facility] [address],  [place], [place]   [facility] - Intensive Outpatient [address], [place], [place]   [facility] ([place].) [place].,  [place], [place] [phone] or [phone]   [facility] Services (RTS) [address], [place], [place] Accepts Medicaid  [facility] [place] [address],  [place] [phone] Substance Abuse/Addiction Treatment   [facility] Organization         Address  Phone  Notes  [facility]  [phone]   Domenic Schwab, PhD [address] Arlis Porta [place], [place]   [phone] or [phone]   [place], [place] [phone]   [place] [address], [place], [place] [phone] Insurance/Medicaid/sponsorship through [facility] and Families [address]                                    [place], [place] [phone] [place] [address], [place] [phone]    Dr. Adele Schilder  [phone]   [facility]  [facility] Dept. 1) [address] [place] 2) [place] 3)  [place] [address], [place] [phone]  [phone]   [place] [phone] or [phone] (After Hours)

## 2014-12-06 NOTE — ED Notes (Signed)
Pt here for cough, congestion, and sinus pressure. sts also hiccups.

## 2015-09-22 ENCOUNTER — Observation Stay (HOSPITAL_COMMUNITY)
Admission: EM | Admit: 2015-09-22 | Discharge: 2015-09-23 | Disposition: A | Discharge status: Home / Self Care | Payer: Self-pay | Attending: Internal Medicine | Admitting: Internal Medicine

## 2015-09-22 ENCOUNTER — Encounter (HOSPITAL_COMMUNITY): Payer: Self-pay | Admitting: Emergency Medicine

## 2015-09-22 ENCOUNTER — Emergency Department (HOSPITAL_COMMUNITY): Payer: Self-pay

## 2015-09-22 ENCOUNTER — Observation Stay (HOSPITAL_COMMUNITY): Payer: Self-pay

## 2015-09-22 DIAGNOSIS — R112 Nausea with vomiting, unspecified: Secondary | ICD-10-CM | POA: Insufficient documentation

## 2015-09-22 DIAGNOSIS — H1131 Conjunctival hemorrhage, right eye: Secondary | ICD-10-CM | POA: Insufficient documentation

## 2015-09-22 DIAGNOSIS — F17210 Nicotine dependence, cigarettes, uncomplicated: Secondary | ICD-10-CM | POA: Insufficient documentation

## 2015-09-22 DIAGNOSIS — R262 Difficulty in walking, not elsewhere classified: Secondary | ICD-10-CM | POA: Insufficient documentation

## 2015-09-22 DIAGNOSIS — E876 Hypokalemia: Secondary | ICD-10-CM | POA: Insufficient documentation

## 2015-09-22 DIAGNOSIS — J45909 Unspecified asthma, uncomplicated: Secondary | ICD-10-CM | POA: Insufficient documentation

## 2015-09-22 DIAGNOSIS — H1130 Conjunctival hemorrhage, unspecified eye: Secondary | ICD-10-CM

## 2015-09-22 DIAGNOSIS — R42 Dizziness and giddiness: Principal | ICD-10-CM

## 2015-09-22 LAB — COMPREHENSIVE METABOLIC PANEL
ALT: 26 U/L (ref 17–63)
AST: 27 U/L (ref 15–41)
Albumin: 3.1 g/dL — ABNORMAL LOW (ref 3.5–5.0)
Alkaline Phosphatase: 47 U/L (ref 38–126)
Anion gap: 8 (ref 5–15)
BUN: <5 mg/dL — ABNORMAL LOW (ref 6–20)
CHLORIDE: 104 mmol/L (ref 101–111)
CO2: 27 mmol/L (ref 22–32)
CREATININE: 1.05 mg/dL (ref 0.61–1.24)
Calcium: 8.3 mg/dL — ABNORMAL LOW (ref 8.9–10.3)
GFR calc non Af Amer: >60 mL/min (ref >60)
Glucose, Bld: 92 mg/dL (ref 65–99)
POTASSIUM: 3.2 mmol/L — AB (ref 3.5–5.1)
SODIUM: 139 mmol/L (ref 135–145)
Total Bilirubin: 0.6 mg/dL (ref 0.3–1.2)
Total Protein: 5.4 g/dL — ABNORMAL LOW (ref 6.5–8.1)

## 2015-09-22 LAB — I-STAT TROPONIN, ED: Troponin i, poc: 0 ng/mL (ref 0.00–0.08)

## 2015-09-22 LAB — CBC
HEMATOCRIT: 45.1 % (ref 39.0–52.0)
HEMOGLOBIN: 14.4 g/dL (ref 13.0–17.0)
MCH: 23 pg — AB (ref 26.0–34.0)
MCHC: 31.9 g/dL (ref 30.0–36.0)
MCV: 72.2 fL — AB (ref 78.0–100.0)
PLATELETS: 220 10*3/uL (ref 150–400)
RBC: 6.25 MIL/uL — AB (ref 4.22–5.81)
RDW: 14.5 % (ref 11.5–15.5)
WBC: 11.2 10*3/uL — ABNORMAL HIGH (ref 4.0–10.5)

## 2015-09-22 LAB — URINALYSIS, ROUTINE W REFLEX MICROSCOPIC
Bilirubin Urine: NEGATIVE
GLUCOSE, UA: NEGATIVE mg/dL
HGB URINE DIPSTICK: NEGATIVE
Ketones, ur: NEGATIVE mg/dL
LEUKOCYTES UA: NEGATIVE
Nitrite: NEGATIVE
PH: 7.5 (ref 5.0–8.0)
Protein, ur: NEGATIVE mg/dL
SPECIFIC GRAVITY, URINE: 1.024 (ref 1.005–1.030)

## 2015-09-22 LAB — RAPID URINE DRUG SCREEN, HOSP PERFORMED
AMPHETAMINES: NOT DETECTED
BARBITURATES: NOT DETECTED
BENZODIAZEPINES: NOT DETECTED
COCAINE: NOT DETECTED
OPIATES: NOT DETECTED
TETRAHYDROCANNABINOL: NOT DETECTED

## 2015-09-22 MED ORDER — MECLIZINE HCL 25 MG PO TABS
25.0000 mg | ORAL_TABLET | Freq: Three times a day (TID) | ORAL | Status: DC
  Filled 2015-09-22: qty 1

## 2015-09-22 MED ORDER — PROMETHAZINE HCL 25 MG/ML IJ SOLN
25.0000 mg | Freq: Four times a day (QID) | INTRAMUSCULAR | Status: DC | PRN
  Administered 2015-09-23: 25 mg via INTRAVENOUS
  Filled 2015-09-22 (×2): qty 1

## 2015-09-22 MED ORDER — ENOXAPARIN SODIUM 40 MG/0.4ML ~~LOC~~ SOLN: 40.0000 mg | SUBCUTANEOUS | Status: DC

## 2015-09-22 MED ORDER — SODIUM CHLORIDE 0.9 % IV BOLUS (SEPSIS)
1000.0000 mL | Freq: Once | INTRAVENOUS | Status: AC
  Administered 2015-09-22: 1000 mL via INTRAVENOUS

## 2015-09-22 MED ORDER — MECLIZINE HCL 25 MG PO TABS
25.0000 mg | ORAL_TABLET | Freq: Once | ORAL | Status: AC
  Administered 2015-09-22: 25 mg via ORAL
  Filled 2015-09-22: qty 1

## 2015-09-22 MED ORDER — MECLIZINE HCL 25 MG PO TABS
25.0000 mg | ORAL_TABLET | Freq: Three times a day (TID) | ORAL | Status: DC | PRN
Start: 2015-09-22 — End: 2015-09-22
  Filled 2015-09-22 (×2): qty 1

## 2015-09-22 MED ORDER — DIAZEPAM 5 MG/ML IJ SOLN
5.0000 mg | Freq: Once | INTRAMUSCULAR | Status: AC
  Administered 2015-09-22: 5 mg via INTRAVENOUS
  Filled 2015-09-22: qty 2

## 2015-09-22 MED ORDER — ONDANSETRON HCL 4 MG/2ML IJ SOLN
4.0000 mg | Freq: Once | INTRAMUSCULAR | Status: AC
  Administered 2015-09-22: 4 mg via INTRAVENOUS
  Filled 2015-09-22: qty 2

## 2015-09-22 MED ORDER — PNEUMOCOCCAL VAC POLYVALENT 25 MCG/0.5ML IJ INJ
0.5000 mL | INJECTION | INTRAMUSCULAR | Status: DC
  Filled 2015-09-22: qty 0.5

## 2015-09-22 MED ORDER — LORAZEPAM 2 MG/ML IJ SOLN
0.5000 mg | Freq: Once | INTRAMUSCULAR | Status: AC
  Administered 2015-09-22: 0.5 mg via INTRAVENOUS
  Filled 2015-09-22: qty 1

## 2015-09-22 MED ORDER — GADOBENATE DIMEGLUMINE 529 MG/ML IV SOLN: 20.0000 mL | Freq: Once | INTRAVENOUS | Status: DC | PRN

## 2015-09-22 MED ORDER — ONDANSETRON HCL 4 MG/2ML IJ SOLN
4.0000 mg | Freq: Four times a day (QID) | INTRAMUSCULAR | Status: DC | PRN
  Administered 2015-09-22: 4 mg via INTRAVENOUS
  Filled 2015-09-22: qty 2

## 2015-09-22 MED ORDER — POTASSIUM CHLORIDE IN NACL 40-0.9 MEQ/L-% IV SOLN
INTRAVENOUS | Status: DC
  Administered 2015-09-22 – 2015-09-23 (×3): 100 mL/h via INTRAVENOUS
  Filled 2015-09-22 (×3): qty 1000

## 2015-09-22 MED ORDER — METOCLOPRAMIDE HCL 5 MG/ML IJ SOLN
10.0000 mg | Freq: Once | INTRAMUSCULAR | Status: AC
  Administered 2015-09-22: 10 mg via INTRAVENOUS
  Filled 2015-09-22: qty 2

## 2015-09-22 MED ORDER — POTASSIUM CHLORIDE 20 MEQ/15ML (10%) PO SOLN
40.0000 meq | Freq: Once | ORAL | Status: AC
  Administered 2015-09-22: 40 meq via ORAL
  Filled 2015-09-22: qty 30

## 2015-09-22 NOTE — ED Notes (Signed)
Pt states "I still have that dizzy feeling"

## 2015-09-22 NOTE — H&P (Signed)
Date: 09/22/2015               Patient Name:  Derek Bush MRN: [id number]  DOB: September 06, 1975 Age / Sex: 40 y.o., male   PCP: No Pcp Per Patient         Medical Service: Internal Medicine Teaching Service         Attending Physician: Dr. Aldine Contes, MD    First Contact: Dr. Ledell Noss  Pager: [phone]  Second Contact: Dr. Charlott Rakes   Pager: [phone]       After Hours (After 5p/  First Contact Pager: [phone]  weekends / holidays): Second Contact Pager: [phone]   Chief Complaint: Dizziness   History of Present Illness: Derek Bush is a 40 y.o. male with a PMH of asthma who presents with dizziness and vomiting for 1 day. He states that he was in his usual state of health until yesterday morning he started feeling dizzy in his sleep when he opened his eyes he felt like the room was spinning and he began vomiting. He has had 8 episodes of vomiting since yesterday morning every time he tried to eat something. He also notes a diffuse headache 10/10 intensity,  ringing in his ear and weakness since yesterday. He has felt his eyes are sensitive to the light as of today, he denies sensitivity to noise. He denies any symptoms like this in the past. He denies longstanding headache, blacking out, facial or focal weakness, hearing loss, recent cough or cold symptoms, sick contacts, or fever. He denies chest pain, shortness of breath, diarrhea, constipation, melena, dysuria, hematuria. He did not receive his flu vaccine this year.   In the ED vital signs were stable, and CT head was negative for acute changes. He received 2 L of NaCl, Antivert, reglan, and was started on zofran. He was seen by neurology and an MRI head was ordered.   Meds:  Spiriva   Allergies: Allergies as of 09/22/2015  . (No Known Allergies)   Past Medical History:  Diagnosis Date  . Asthma     Family History:  No family history of heart disease or stroke.   Social History:  He has been smoking  cigarettes for 20 years, currently 1 pack last him about 1.5 days.  He denies alcohol or illicit drug use.   Review of Systems: A complete ROS was negative except as per HPI.   Physical Exam: Vitals:   09/22/15 0845 09/22/15 0900 09/22/15 0915 09/22/15 0930  BP: 133/72 126/73 135/82 125/85  Pulse: 77 83 83 67  Resp: 16 18 14 14   Temp:      SpO2:  92% 91% 96%  Weight:      Height:       Physical Exam  Constitutional: He appears well-developed and well-nourished. He appears lethargic.  HENT:  Head: Normocephalic and atraumatic.  Eyes: EOM are normal. Pupils are equal, round, and reactive to light. Right conjunctiva has a hemorrhage. Left conjunctiva has no hemorrhage.  Cardiovascular: Normal rate and regular rhythm.   No murmur heard. Pulmonary/Chest: He has no wheezes. He has no rales.  Abdominal: Soft. He exhibits no distension. There is no tenderness. There is no guarding.  Neurological: He has normal strength. He appears lethargic. No cranial nerve deficit.     Labs: CBC:  Recent Labs Lab 09/22/15 0400  WBC 11.2*  HGB 14.4  HCT 45.1  MCV 72.2*  PLT [id number]    Basic Metabolic Panel:  Recent Labs Lab 09/22/15 0400  NA 139  K 3.2*  CL 104  CO2 27  GLUCOSE 92  BUN <5*  CREATININE 1.05  CALCIUM 8.3*    Cardiac Enzymes:  Recent Labs Lab 09/22/15 0422  TROPIPOC 0.00   Liver Function Tests:  Recent Labs Lab 09/22/15 0400  AST 27  ALT 26  ALKPHOS 47  BILITOT 0.6  PROT 5.4*  ALBUMIN 3.1*   Urine Studies: Urinalysis    Component Value Date/Time   COLORURINE YELLOW 09/22/2015 0433   APPEARANCEUR CLEAR 09/22/2015 0433   LABSPEC 1.024 09/22/2015 0433   PHURINE 7.5 09/22/2015 0433   GLUCOSEU NEGATIVE 09/22/2015 0433   HGBUR NEGATIVE 09/22/2015 0433   BILIRUBINUR NEGATIVE 09/22/2015 0433   KETONESUR NEGATIVE 09/22/2015 0433   PROTEINUR NEGATIVE 09/22/2015 0433   UROBILINOGEN 1.0 12/19/2007 1037   NITRITE NEGATIVE 09/22/2015 0433   LEUKOCYTESUR  NEGATIVE 09/22/2015 0433     Drugs of Abuse     Component Value Date/Time   LABOPIA NONE DETECTED 09/22/2015 0433   COCAINSCRNUR NONE DETECTED 09/22/2015 0433   LABBENZ NONE DETECTED 09/22/2015 0433   AMPHETMU NONE DETECTED 09/22/2015 0433   THCU NONE DETECTED 09/22/2015 0433   LABBARB NONE DETECTED 09/22/2015 0433     EKG: EKG: sinus bradycardia with one PVC  Imaging: Ct Head Wo Contrast  Result Date: 09/22/2015 CLINICAL DATA:  Vertigo, headache and nausea. EXAM: CT HEAD WITHOUT CONTRAST TECHNIQUE: Contiguous axial images were obtained from the base of the skull through the vertex without intravenous contrast. COMPARISON:  None. FINDINGS: BRAIN: The ventricles and sulci are normal. No intraparenchymal hemorrhage, mass effect nor midline shift. No acute large vascular territory infarcts. No abnormal extra-axial fluid collections. Basal cisterns are patent. VASCULAR: Unremarkable. SKULL/SOFT TISSUES: No skull fracture. No significant soft tissue swelling. ORBITS/SINUSES: The included ocular globes and orbital contents are normal.Trace ethmoid mucosal thickening. Mastoid air cells are well aerated. OTHER: Patient is edentulous. IMPRESSION: Negative CT HEAD. Electronically Signed   By: Elon Alas M.D.   On: 09/22/2015 04:47    Assessment & Plan by Problem: Derek Bush is a 40 y.o. male with PMH asthma. Presented today with vertigo and vomiting.   Active Problems:   Vertigo  1. Vertigo - History of vertigo, tinnitis, and nausea since yesterday morning. He denies syncopal symptoms but had some observed changes in rhythm while we saw him in the ED. He has no focal brain stem or neurologic deficits so a central cause of vertigo is less likely.  - follow up head MRI  - follow up PT vestibular evaluation and recommendations.   2. Arrhythmia - Cardiac monitoring in the ED showed fluctuation from tachy to brady  - admit to tele   3. Nausea - Most likely secondary to his  symptoms of vertigo.  - continue meclizine, and zofran   Hypokalemia  -40 meq bolus and potassium added to his NaCl drip  -follow up BMET   F NaCl with KCl E Hypokalemia  N clear liquid  DVT Ppx SCD Code Status Full  Dispo: Admit patient to Inpatient with expected length of stay greater than 2 midnights.  Signed: Ledell Noss, MD 09/22/2015, 9:52 AM  Pager: [phone]

## 2015-09-22 NOTE — ED Notes (Signed)
Lattie Haw PA made aware that the pt threw up his medication and that he is still feeling dizzy.

## 2015-09-22 NOTE — ED Notes (Signed)
Given in MRI due to pt vomiting.

## 2015-09-22 NOTE — Consult Note (Signed)
Initial Neurological Consultation                      NEURO HOSPITALIST CONSULT NOTE   Requestig physician: Dr. Dareen Piano   Reason for Consult:  Dizziness with nausea and vomiting.   HPI:                                                                                                                                          Derek Bush is an 40 y.o. male with no significant past medical history with the exception of asthma who presents with the sudden onset of dizziness that began yesterday morning. He reports he was dreaming that he was dizzy but when he woke up he actually was quite dizzy this has progressed over the course of the day and is now associated with nausea and vomiting. He has been admitted to the ER for further evaluation. CT of the brain was normal. He reports some mild dizziness in the past but nothing of this nature. He otherwise describes no focal neurological complaints. There is no numbness or weakness in extremities. No diplopia or facial asymmetry. He has not taken any new medications recently. He reports no history of alcohol or drug use.   Past Medical History:  Diagnosis Date  . Asthma     Past Surgical History:  Procedure Laterality Date  . KNEE SURGERY      MEDICATIONS:                                                                                                                     I have reviewed the patient's current medications.  No Known Allergies   Social History:  reports that he has been smoking Cigarettes.  He has been smoking about 0.50 packs per day. He has never used smokeless tobacco. He reports that he does not drink alcohol or use drugs.  No family history on file.   ROS:  History obtained from chart review  General ROS: negative for - chills, fatigue, fever, night sweats, weight gain or weight  loss Psychological ROS: negative for - behavioral disorder, hallucinations, memory difficulties, mood swings or suicidal ideation Ophthalmic ROS: negative for - blurry vision, double vision, eye pain or loss of vision ENT ROS: negative for - epistaxis, nasal discharge, oral lesions, sore throat, tinnitus or vertigo Allergy and Immunology ROS: negative for - hives or itchy/watery eyes Hematological and Lymphatic ROS: negative for - bleeding problems, bruising or swollen lymph nodes Endocrine ROS: negative for - galactorrhea, hair pattern changes, polydipsia/polyuria or temperature intolerance Respiratory ROS: negative for - cough, hemoptysis, shortness of breath or wheezing Cardiovascular ROS: negative for - chest pain, dyspnea on exertion, edema or irregular heartbeat Gastrointestinal ROS: negative for - abdominal pain, diarrhea, hematemesis, nausea/vomiting or stool incontinence Genito-Urinary ROS: negative for - dysuria, hematuria, incontinence or urinary frequency/urgency Musculoskeletal ROS: negative for - joint swelling or muscular weakness Neurological ROS: as noted in HPI Dermatological ROS: negative for rash and skin lesion changes   General Exam                                                                                                      Blood pressure 132/81, pulse 61, temperature 98.1 F (36.7 C), resp. rate 16, height 5\' 10"  (1.778 m), weight 99.8 kg (220 lb), SpO2 90 %. HEENT-  Normocephalic, no lesions, without obvious abnormality.  Normal external eye and conjunctiva.  Normal TM's bilaterally.  Normal auditory canals and external ears. Normal external nose, mucus membranes and septum.  Normal pharynx. Cardiovascular- regular rate and rhythm, S1, S2 normal, no murmur, click, rub or gallop, pulses palpable throughout   Lungs- chest clear, no wheezing, rales, normal symmetric air entry, Heart exam - S1, S2 normal, no murmur, no gallop, rate regular Abdomen- soft,  non-tender; bowel sounds normal; no masses,  no organomegaly Extremities- less then 2 second capillary refill  Neurological Examination Mental Status: Alert, oriented, thought content appropriate.  Speech fluent without evidence of aphasia.  Able to follow 3 step commands without difficulty. Cranial Nerves: Pupils are equal round reactive to light and accommodation. Extraocular movements are intact. There is no significant nystagmus. There is no facial asymmetry. Neck flexion and shoulder shrug are 5 out of 5. Tongue movements are full. Motor: Right : Upper extremity   5/5    Left:     Upper extremity   5/5  Lower extremity   5/5     Lower extremity   5/5 Tone and bulk:normal tone throughout; no atrophy noted Sensory: Pinprick and light touch intact throughout, bilaterally Deep Tendon Reflexes: 2+ and symmetric throughout Plantars: Right: downgoing   Left: downgoing Cerebellar: normal finger-to-nose, normal rapid alternating movements  Gait: normal gait and station      Lab Results: Basic Metabolic Panel:  Recent Labs Lab 09/22/15 0400  NA 139  K 3.2*  CL 104  CO2 27  GLUCOSE 92  BUN <5*  CREATININE 1.05  CALCIUM 8.3*    Liver Function Tests:  Recent Labs Lab  09/22/15 0400  AST 27  ALT 26  ALKPHOS 47  BILITOT 0.6  PROT 5.4*  ALBUMIN 3.1*   No results for input(s): LIPASE, AMYLASE in the last 168 hours. No results for input(s): AMMONIA in the last 168 hours.  CBC:  Recent Labs Lab 09/22/15 0400  WBC 11.2*  HGB 14.4  HCT 45.1  MCV 72.2*  PLT 220    Cardiac Enzymes: No results for input(s): CKTOTAL, CKMB, CKMBINDEX, TROPONINI in the last 168 hours.  Lipid Panel: No results for input(s): CHOL, TRIG, HDL, CHOLHDL, VLDL, LDLCALC in the last 168 hours.  CBG: No results for input(s): GLUCAP in the last 168 hours.  Microbiology: No results found for this or any previous visit.  Coagulation Studies: No results for input(s): LABPROT, INR in the last  72 hours.  Imaging: Ct Head Wo Contrast  Result Date: 09/22/2015 CLINICAL DATA:  Vertigo, headache and nausea. EXAM: CT HEAD WITHOUT CONTRAST TECHNIQUE: Contiguous axial images were obtained from the base of the skull through the vertex without intravenous contrast. COMPARISON:  None. FINDINGS: BRAIN: The ventricles and sulci are normal. No intraparenchymal hemorrhage, mass effect nor midline shift. No acute large vascular territory infarcts. No abnormal extra-axial fluid collections. Basal cisterns are patent. VASCULAR: Unremarkable. SKULL/SOFT TISSUES: No skull fracture. No significant soft tissue swelling. ORBITS/SINUSES: The included ocular globes and orbital contents are normal.Trace ethmoid mucosal thickening. Mastoid air cells are well aerated. OTHER: Patient is edentulous. IMPRESSION: Negative CT HEAD. Electronically Signed   By: Elon Alas M.D.   On: 09/22/2015 04:47    Assessment/Plan:  Derek Bush is a pleasant 40 year old gentleman who presents today with the sudden onset of significant vertigo that began yesterday morning and has progressed. His neurologic exam is unremarkable with the exception of the vertiginous sensation. There are no focal neurological complaints. He reports some mild ringing in the ears. However, he has no prior history of that sensation. The vertigo is severe and is associated with nausea and vomiting.  The differential diagnosis for vertigo of this nature is fairly broad and includes a number of considerations. Vestibular dysfunction is one of the most common causes of this presentation. However, it is important to rule out other considerations such as endocrine, metabolic, infectious, medication related, and potentially ischemic events among others. Mnire's disease with otolith catastrophe of [place] can also present in a similar fashion. Benign vestibular neuronitis is also a common disorder presenting in this fashion. Although the patient does not report a  history of migraine, vestibular migraine can also present in this manner.  MRI of the brain will be requested. If the neurological evaluations are unremarkable additional ENT studies may be requested.   Plan:  1. MRI brain to rule out ischemic event.  2. Will follow his progress with you.     Stina Gane A. Tasia Catchings, M.D. Neurohospitalist Phone: [phone]  09/22/2015, 9:18 AM

## 2015-09-22 NOTE — ED Notes (Signed)
Admitting at bedside 

## 2015-09-22 NOTE — ED Notes (Signed)
Attempted to call report

## 2015-09-22 NOTE — ED Notes (Signed)
Patient transported to MRI 

## 2015-09-22 NOTE — ED Provider Notes (Signed)
[facility] DEPT Provider Note   CSN: [id number] Arrival date & time: 09/22/15  [id number]     History   Chief Complaint Chief Complaint  Patient presents with  . Emesis    HPI Derek Bush is a 40 y.o. male.  HPI Derek Bush is a 40 y.o. male with no medical problems, presents to ED with intermittent vertigo symptoms, with associated nausea, vomiting, headache. Patient states his symptoms started in a dream. Patient states that he was dreaming that something in front was spinning. Patient states when he woke up he noticed that the room was spinning. He states he immediately started to throw up. Reports recurrent episodes throught the day. States more than 10 episodes of vomiting. Reports associate headache. Denies head injury. Denies neck pain or stiffness. No Fever. No abdominal pain. No diarrhea. No urinary symptoms. No hx of the same. Denies any other neurological complaints.   Past Medical History:  Diagnosis Date  . Asthma     There are no active problems to display for this patient.   Past Surgical History:  Procedure Laterality Date  . KNEE SURGERY         Home Medications    Prior to Admission medications   Medication Sig Start Date End Date Taking? Authorizing Provider  acetaminophen (TYLENOL) 500 MG tablet Take 1 tablet (500 mg total) by mouth every 6 (six) hours as needed. Patient not taking: Reported on 09/22/2015 12/06/14   Gloriann Loan, PA-C  benzonatate (TESSALON) 100 MG capsule Take 1 capsule (100 mg total) by mouth 3 (three) times daily as needed for cough. Patient not taking: Reported on 09/22/2015 12/06/14   Gloriann Loan, PA-C  docusate sodium (COLACE) 100 MG capsule Take 1 capsule (100 mg total) by mouth every 12 (twelve) hours. Patient not taking: Reported on 09/22/2015 07/25/12   Antonietta Breach, PA-C  Lidocaine, Anorectal, 5 % GEL Apply 15 mLs topically every 4 (four) hours as needed (pain). Patient not taking: Reported on 09/22/2015 07/25/12   Antonietta Breach, PA-C    Family History No family history on file.  Social History Social History  Substance Use Topics  . Smoking status: Current Every Day Smoker    Packs/day: 0.50    Types: Cigarettes  . Smokeless tobacco: Never Used  . Alcohol use No     Allergies   Review of patient's allergies indicates no known allergies.   Review of Systems Review of Systems  Constitutional: Negative for chills and fever.  Respiratory: Negative for cough, chest tightness and shortness of breath.   Cardiovascular: Negative for chest pain, palpitations and leg swelling.  Gastrointestinal: Positive for nausea and vomiting. Negative for abdominal distention, abdominal pain and diarrhea.  Genitourinary: Negative for dysuria, frequency, hematuria and urgency.  Musculoskeletal: Negative for arthralgias, myalgias, neck pain and neck stiffness.  Skin: Negative for rash.  Allergic/Immunologic: Negative for immunocompromised state.  Neurological: Positive for dizziness and light-headedness. Negative for syncope, speech difficulty, weakness, numbness and headaches.  All other systems reviewed and are negative.    Physical Exam Updated Vital Signs BP 127/88   Pulse 72   Temp 98.1 F (36.7 C)   Resp 16   Ht 5\' 10"  (1.778 m)   Wt 99.8 kg   SpO2 93%   BMI 31.57 kg/m   Physical Exam  Constitutional: He is oriented to person, place, and time. He appears well-developed and well-nourished. No distress.  HENT:  Head: Normocephalic and atraumatic.  Eyes: Conjunctivae and EOM are normal.  Pupils are equal, round, and reactive to light.  Neck: Normal range of motion. Neck supple.  Cardiovascular: Normal rate, regular rhythm and normal heart sounds.   Pulmonary/Chest: Effort normal. No respiratory distress. He has no wheezes. He has no rales.  Abdominal: Soft. Bowel sounds are normal. He exhibits no distension. There is no tenderness. There is no rebound.  Musculoskeletal: He exhibits no edema.    Neurological: He is alert and oriented to person, place, and time.  5/5 and equal upper and lower extremity strength bilaterally. Equal grip strength bilaterally. Normal finger to nose and heel to shin. No pronator drift.   Skin: Skin is warm and dry. Capillary refill takes less than 2 seconds.  Nursing note and vitals reviewed.    ED Treatments / Results  Labs (all labs ordered are listed, but only abnormal results are displayed) Labs Reviewed  COMPREHENSIVE METABOLIC PANEL - Abnormal; Notable for the following:       Result Value   Potassium 3.2 (*)    BUN <5 (*)    Calcium 8.3 (*)    Total Protein 5.4 (*)    Albumin 3.1 (*)    All other components within normal limits  CBC - Abnormal; Notable for the following:    WBC 11.2 (*)    RBC 6.25 (*)    MCV 72.2 (*)    MCH 23.0 (*)    All other components within normal limits  URINALYSIS, ROUTINE W REFLEX MICROSCOPIC (NOT AT [facility])  URINE RAPID DRUG SCREEN, HOSP PERFORMED  I-STAT TROPOININ, ED    EKG  EKG Interpretation  Date/Time:  Saturday September 22 2015 04:06:03 EDT Ventricular Rate:  57 PR Interval:    QRS Duration: 104 QT Interval:  428 QTC Calculation: 417 R Axis:   35 Text Interpretation:  Sinus rhythm Ventricular premature complex Probable anteroseptal infarct, old No old tracing to compare Confirmed by FLOYD MD, DANIEL [phone]) on 09/22/2015 5:10:13 AM       Radiology Ct Head Wo Contrast  Result Date: 09/22/2015 CLINICAL DATA:  Vertigo, headache and nausea. EXAM: CT HEAD WITHOUT CONTRAST TECHNIQUE: Contiguous axial images were obtained from the base of the skull through the vertex without intravenous contrast. COMPARISON:  None. FINDINGS: BRAIN: The ventricles and sulci are normal. No intraparenchymal hemorrhage, mass effect nor midline shift. No acute large vascular territory infarcts. No abnormal extra-axial fluid collections. Basal cisterns are patent. VASCULAR: Unremarkable. SKULL/SOFT TISSUES: No skull  fracture. No significant soft tissue swelling. ORBITS/SINUSES: The included ocular globes and orbital contents are normal.Trace ethmoid mucosal thickening. Mastoid air cells are well aerated. OTHER: Patient is edentulous. IMPRESSION: Negative CT HEAD. Electronically Signed   By: Elon Alas M.D.   On: 09/22/2015 04:47    Procedures Procedures (including critical care time)  Medications Ordered in ED Medications  LORazepam (ATIVAN) injection 0.5 mg (0.5 mg Intravenous Given 09/22/15 0426)  ondansetron (ZOFRAN) injection 4 mg (4 mg Intravenous Given 09/22/15 0426)  sodium chloride 0.9 % bolus 1,000 mL (1,000 mLs Intravenous New Bag/Given 09/22/15 0426)     Initial Impression / Assessment and Plan / ED Course  I have reviewed the triage vital signs and the nursing notes.  Pertinent labs & imaging results that were available during my care of the patient were reviewed by me and considered in my medical decision making (see chart for details).  Clinical Course   Pt with vertigo like symptoms. Unable to move. Vomiting. Normal neurological exam otherwise. Will get CT head, labs, fluids  and ativan ordered. Pt has tried meclizine at home.   5:24 AM pts labs show hypokalemia, potassium 3.2, slightly elevated WBC at 11.2, ca of 8.3, protein 5.4, albumin 3.1. VS normal. CT head negative. Pt not improved with ativan 0.5mg  IV. Discussed with Dr. Tyrone Nine. Will try valium and meclizine.   5:54 AM Held potassium and meclizine since still nauseated. Patient received Valium. Patient states the spinning sensation seemed to stop, however he is now sedated on the medications and having trouble sitting up. I was able to set him up for about 20 seconds but he then collapsed back down into the stretcher. He states that his nausea and spinning sensation are improved. We'll monitor and give fluids.   Pt signed out at shift change. If dizziness not improving when pt more awake, will need admission.    Final  Clinical Impressions(s) / ED Diagnoses   Final diagnoses:  Vertigo  Nausea and vomiting, vomiting of unspecified type    New Prescriptions New Prescriptions   No medications on file     Jeannett Senior, PA-C 09/23/15 [place], DO 09/24/15 [phone]

## 2015-09-22 NOTE — ED Triage Notes (Signed)
Pt arrives with c/o nausea/vomiting since 1130 yesterday morning. Reprots 6-7 episodes of vomiting, no diarrhea - had a normal stool yesterday. Reports intermittent episodes of dizziness. Reports 1/10 L lower quadrant abdominal pain that just started. Hx GERD

## 2015-09-22 NOTE — ED Provider Notes (Signed)
Care signed out to be by PA Kirichenko at shift change.  See her note for full H&P.  Briefly, 40 y.o. M here with vertiginous type symptoms which began as a dream but were present upon waking.  He has associated nausea and vomiting. He has no known neurologic history. No recent head trauma. Not currently on anticoagulation.  Lab work and CT had reassuring.  Plan:  Patient drowsy from medications here.  Will allow to metabolize and reassess when more aware.  Results for orders placed or performed during the hospital encounter of 09/22/15  Comprehensive metabolic panel  Result Value Ref Range   Sodium 139 135 - 145 mmol/L   Potassium 3.2 (L) 3.5 - 5.1 mmol/L   Chloride 104 101 - 111 mmol/L   CO2 27 22 - 32 mmol/L   Glucose, Bld 92 65 - 99 mg/dL   BUN <5 (L) 6 - 20 mg/dL   Creatinine, Ser 1.05 0.61 - 1.24 mg/dL   Calcium 8.3 (L) 8.9 - 10.3 mg/dL   Total Protein 5.4 (L) 6.5 - 8.1 g/dL   Albumin 3.1 (L) 3.5 - 5.0 g/dL   AST 27 15 - 41 U/L   ALT 26 17 - 63 U/L   Alkaline Phosphatase 47 38 - 126 U/L   Total Bilirubin 0.6 0.3 - 1.2 mg/dL   GFR calc non Af Amer >60 >60 mL/min   GFR calc Af Amer >60 >60 mL/min   Anion gap 8 5 - 15  CBC  Result Value Ref Range   WBC 11.2 (H) 4.0 - 10.5 K/uL   RBC 6.25 (H) 4.22 - 5.81 MIL/uL   Hemoglobin 14.4 13.0 - 17.0 g/dL   HCT 45.1 39.0 - 52.0 %   MCV 72.2 (L) 78.0 - 100.0 fL   MCH 23.0 (L) 26.0 - 34.0 pg   MCHC 31.9 30.0 - 36.0 g/dL   RDW 14.5 11.5 - 15.5 %   Platelets 220 150 - 400 K/uL  Urinalysis, Routine w reflex microscopic  Result Value Ref Range   Color, Urine YELLOW YELLOW   APPearance CLEAR CLEAR   Specific Gravity, Urine 1.024 1.005 - 1.030   pH 7.5 5.0 - 8.0   Glucose, UA NEGATIVE NEGATIVE mg/dL   Hgb urine dipstick NEGATIVE NEGATIVE   Bilirubin Urine NEGATIVE NEGATIVE   Ketones, ur NEGATIVE NEGATIVE mg/dL   Protein, ur NEGATIVE NEGATIVE mg/dL   Nitrite NEGATIVE NEGATIVE   Leukocytes, UA NEGATIVE NEGATIVE  Rapid urine drug  screen (hospital performed)  Result Value Ref Range   Opiates NONE DETECTED NONE DETECTED   Cocaine NONE DETECTED NONE DETECTED   Benzodiazepines NONE DETECTED NONE DETECTED   Amphetamines NONE DETECTED NONE DETECTED   Tetrahydrocannabinol NONE DETECTED NONE DETECTED   Barbiturates NONE DETECTED NONE DETECTED  I-Stat Troponin, ED (not at [facility])  Result Value Ref Range   Troponin i, poc 0.00 0.00 - 0.08 ng/mL   Comment 3           Ct Head Wo Contrast  Result Date: 09/22/2015 CLINICAL DATA:  Vertigo, headache and nausea. EXAM: CT HEAD WITHOUT CONTRAST TECHNIQUE: Contiguous axial images were obtained from the base of the skull through the vertex without intravenous contrast. COMPARISON:  None. FINDINGS: BRAIN: The ventricles and sulci are normal. No intraparenchymal hemorrhage, mass effect nor midline shift. No acute large vascular territory infarcts. No abnormal extra-axial fluid collections. Basal cisterns are patent. VASCULAR: Unremarkable. SKULL/SOFT TISSUES: No skull fracture. No significant soft tissue swelling. ORBITS/SINUSES: The included ocular  globes and orbital contents are normal.Trace ethmoid mucosal thickening. Mastoid air cells are well aerated. OTHER: Patient is edentulous. IMPRESSION: Negative CT HEAD. Electronically Signed   By: Elon Alas M.D.   On: 09/22/2015 04:47    7:51 AM Patient with persistent vomiting despite treatments here.  Reports continued room spinning sensation.  States he does not feel much improved from his arrival here.  At this point, patient has failed several treatments in the ED, continues vomiting.  Will need admission for intractable vertigo.  Additional IVF and reglan ordered.  8:56 AM Spoke with IM teaching service who will admit.  Temp admit orders placed.  Neurology, Dr. Tasia Catchings-- also consulted, will see patient and provide any recommendations.   Larene Pickett, PA-C 09/22/15 [place], DO 09/23/15 [phone]

## 2015-09-22 NOTE — ED Notes (Signed)
Pt denies having any dizziness at this time.

## 2015-09-23 DIAGNOSIS — R42 Dizziness and giddiness: Secondary | ICD-10-CM

## 2015-09-23 LAB — BASIC METABOLIC PANEL
Anion gap: 6 (ref 5–15)
CO2: 26 mmol/L (ref 22–32)
CREATININE: 1.1 mg/dL (ref 0.61–1.24)
Calcium: 7.7 mg/dL — ABNORMAL LOW (ref 8.9–10.3)
Chloride: 106 mmol/L (ref 101–111)
GFR calc Af Amer: >60 mL/min (ref >60)
Glucose, Bld: 113 mg/dL — ABNORMAL HIGH (ref 65–99)
Potassium: 3.7 mmol/L (ref 3.5–5.1)
SODIUM: 138 mmol/L (ref 135–145)

## 2015-09-23 MED ORDER — MECLIZINE HCL 25 MG PO TABS: 25.0000 mg | ORAL_TABLET | Freq: Three times a day (TID) | ORAL | 0 refills | Status: DC | PRN

## 2015-09-23 NOTE — Evaluation (Signed)
Physical Therapy Evaluation Patient Details Name: Derek Bush MRN: [id number] DOB: 1975-12-22 Today's Date: 09/23/2015   History of Present Illness    40 year old gentleman who presented with a vertiginous sensation associated with nausea and vomiting. His neurologic exam is otherwise normal. His MRI was also normal.   Clinical Impression  Pt presents with minimal to moderate functional limitations due to vestibular dysfunction, balance dysfunction, visual dysfunction all impacting ability to walk unassisted.  Symptoms improved in sitting, however incr challenges to activity (standing, turning, walking) provoked symptoms and LOB to right.    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  Pt provided extended interventions to include gaze stability retraining and motion desensitizing exercises.  Pt demonstrated modest improvement over course of lengthy treatment this encounter, and will benefit from OPPT to address and further rehabilitate vestibular function.  Unfortunately pt is uninsured and unlikely to be able to follow through with recommendations.  Handout provided with recommended exercises.  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    Support d/c to home with mild reservations due to lack of resources to participate in therapy, however hopeful pt will follow through with home exercise prescription and will regain normal balance function.  MD, please address use of antivertiginous medication on an as-needed basis to allow vestibular system the necessary stimulation to reogranize and recalibrate for optimal recovery.  Thank you for this interesting consultation. Please see below and refer to Vestibular Assessment for details of exam findings.     Follow Up Recommendations Outpatient PT (vestibular rehabilitation)    Equipment Recommendations  None recommended by PT     Recommendations for Other Services       Precautions / Restrictions Precautions Precautions: Fall (dysequillibrium with turns to right) Restrictions Weight Bearing Restrictions: No      Mobility  Bed Mobility Overal bed mobility: Independent             General bed mobility comments: other than increased time due to apprehension of and presentation of dizziness  Transfers Overall transfer level: Needs assistance Equipment used: None Transfers: Sit to/from Stand Sit to Stand: Supervision;Min guard (hands on)         General transfer comment: hands on for safety initially, progressing to observation with encouragement and instruction to avoid falling or loss of balance  Ambulation/Gait Ambulation/Gait assistance: Min assist Ambulation Distance (Feet): 300 Feet Assistive device: None;1 person hand held assist Gait Pattern/deviations:  (guarded and fearful of dizzy/loss of balance)   Gait velocity interpretation: at or above normal speed for age/gender General Gait Details: slow and cautious initially, progresses to incr speed with encouragement; turns of head/body to right provoke LOB and pt stops, braces, closes eyes to recover ~30 seconds; improves over time but still requires at least supervision for safety   Stairs Stairs: Yes Stairs assistance: Min guard Stair Management: One rail Right;Alternating pattern;Forwards Number of Stairs: 3 General stair comments: pt has 6 steps to enter, do not expect he will be limited; no provocation of symptoms with stairs  Wheelchair Mobility    Modified Rankin (Stroke Patients Only)       Balance Overall balance assessment: Needs assistance Sitting-balance support: No upper extremity supported;Feet supported Sitting balance-Leahy Scale: Normal Sitting balance - Comments: able to reach down and adjust socks no LOB   Standing balance support: No upper extremity supported;During functional activity Standing  balance-Leahy Scale: Good Standing balance  comment: variable as described in context of susupected RIGHT vestibular hypofunction             High level balance activites: Turns;Sudden stops;Head turns High Level Balance Comments: see dgi Standardized Balance Assessment Standardized Balance Assessment : Dynamic Gait Index   Dynamic Gait Index Level Surface: Normal Change in Gait Speed: Normal Gait with Horizontal Head Turns: Moderate Impairment Gait with Vertical Head Turns: Mild Impairment Gait and Pivot Turn: Moderate Impairment Step Over Obstacle: Normal Step Around Obstacles: Normal Steps: Mild Impairment Total Score: 18       Pertinent Vitals/Pain Pain Assessment: 0-10 Pain Score: 4  Pain Location: right hand edema after failed iv attepmt Pain Descriptors / Indicators: Sore;Tender;Tightness Pain Intervention(s): Limited activity within patient's tolerance;Monitored during session;Ice applied    Home Living Family/patient expects to be discharged to:: Private residence Living Arrangements: Spouse/significant other Available Help at Discharge: Family;Available PRN/intermittently Type of Home: House Home Access: Stairs to enter Entrance Stairs-Rails: Can reach both Entrance Stairs-Number of Steps: 6 Home Layout: One level Home Equipment: None Additional Comments: 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    Prior Function Level of Independence: Independent         Comments: driving, no dependencies or limitations prior to this episode     Hand Dominance   Dominant Hand: Right    Extremity/Trunk Assessment   Upper Extremity Assessment: Overall WFL for tasks assessed (except right hand as noted)           Lower Extremity Assessment: Overall WFL for tasks assessed      Cervical / Trunk Assessment: Normal  Communication   Communication: No difficulties  Cognition Arousal/Alertness: Awake/alert Behavior During  Therapy: WFL for tasks assessed/performed;Anxious Overall Cognitive Status: Within Functional Limits for tasks assessed                      General Comments General comments (skin integrity, edema, etc.): edema in right hand from failed iv per patient; applied ice    Exercises Other Exercises Other Exercises: seated VOR x1; 10-15 rotations at speed, mild dizziness with adequate speed and maintains gaze Other Exercises: standing VOR x1; 10-15 rotations; mild dizziness, able to achieve speed and maintain stability over time Other Exercises: walking VOR x1; 5 rotations max tolerance; initially LOB to right, instruction to maintain gaze for substitution improved performance, however speed suboptimal for normal function  Other Exercises: brandt-daroff did not provoke therefore not prescribed      Assessment/Plan    PT Assessment All further PT needs can be met in the next venue of care  PT Diagnosis Difficulty walking (right vestibular hypofunction)   PT Problem List Decreased balance (unilateral vestibular hypofunction)  PT Treatment Interventions     PT Goals (Current goals can be found in the Care Plan section) Acute Rehab PT Goals 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; not vomit or fall PT Goal Formulation: All assessment and education complete, DC therapy Potential to Achieve Goals: Good    Frequency     Barriers to discharge        Co-evaluation               End of Session Equipment Utilized During Treatment: Gait belt Activity Tolerance: Patient tolerated treatment well Patient left: in bed;with call bell/phone within reach Nurse Communication: Mobility status    Functional Assessment Tool Used: DGI, observation Functional Limitation: Mobility: Walking and moving around Mobility: Walking and  Moving Around Current Status [phone]): At least 20 percent but less than 40 percent impaired, limited or restricted Mobility: Walking and Moving  Around Goal Status [phone]): At least 1 percent but less than 20 percent impaired, limited or restricted    Time: 0915-1025 PT Time Calculation (min) (ACUTE ONLY): 70 min   Charges:   PT Evaluation $PT Eval Moderate Complexity: 1 Procedure PT Treatments $Gait Training: 8-22 mins $Neuromuscular Re-education: 8-22 mins $Canalith Rep Proc: 8-22 mins $Physical Performance Test: 8-22 mins   PT G Codes:   PT G-Codes **NOT FOR INPATIENT CLASS** Functional Assessment Tool Used: DGI, observation Functional Limitation: Mobility: Walking and moving around Mobility: Walking and Moving Around Current Status ([id number]): At least 20 percent but less than 40 percent impaired, limited or restricted Mobility: Walking and Moving Around Goal Status [phone]): At least 1 percent but less than 20 percent impaired, limited or restricted    Derek Bush 09/23/2015, 10:59 AM

## 2015-09-23 NOTE — Progress Notes (Signed)
Subjective: [name] is feeling much better today. yesterday he was able to eat and did not vomit. He was seen by physical therapy received vestibular therapy he states he understands the exercises and will be able to perform them at home. He says he feels much steadier on his feet today and is ready to go home.   Objective:  Vital signs in last 24 hours: Vitals:   09/22/15 1156 09/22/15 1235 09/22/15 2217 09/23/15 0530  BP: 134/93 127/75 130/65 128/75  Pulse: 76 (!) 59 60 64  Resp: 18 16 18 20   Temp:  98.2 F (36.8 C) 98.2 F (36.8 C) 98 F (36.7 C)  TempSrc:  Oral    SpO2: 98% 98% 95% 96%  Weight:      Height:       Physical Exam  Constitutional: He appears well-developed and well-nourished. No distress.  Eyes: EOM are normal. Pupils are equal, round, and reactive to light. Right conjunctiva has a hemorrhage.  Right eye conjunctival hemorrhage  No visual deficits   Cardiovascular: Normal rate and regular rhythm.   No murmur heard. Pulmonary/Chest: Effort normal. He has no wheezes. He has no rales.  Abdominal: Soft. He exhibits no distension. There is no tenderness. There is no guarding.  Neurological: He is alert. He has normal strength.  No nystagmus    Labs: CBC:  Recent Labs Lab 09/22/15 0400  WBC 11.2*  HGB 14.4  HCT 45.1  MCV 72.2*  PLT [id number]   Metabolic Panel:  Recent Labs Lab 09/22/15 0400 09/23/15 0538  NA 139 138  K 3.2* 3.7  CL 104 106  CO2 27 26  GLUCOSE 92 113*  BUN <5* <5*  CREATININE 1.05 1.10  CALCIUM 8.3* 7.7*  ALT 26  --   ALKPHOS 47  --   BILITOT 0.6  --   PROT 5.4*  --   ALBUMIN 3.1*  --    Cardiac Labs:  Recent Labs Lab 09/22/15 0422  TROPIPOC 0.00    Imaging: CT head 09/22/2015 No intraparenchymal hemorrhage, mass effect nor midline shift. No acute large vascular territory infarcts MRI head 09/22/2015 Ventricle size normal.  Cerebral volume normal. Negative for acute or chronic infarction. Negative for  demyelinating disease. Cerebral white matter normal. Basal ganglia brainstem and cerebellum normal Negative for intracranial hemorrhage. Negative for mass or edema. Normal enhancement following contrast infusion. No enhancing mass lesion. Mild mucosal edema in the paranasal sinuses. Normal orbital structures. Pituitary not enlarged. IMPRESSION: Normal MRI of the brain with contrast   Medications: IPRN Medications: gadobenate dimeglumine, promethazine   Assessment/Plan: Pt is a 40 y.o. yo male with a PMHx of asthma  who was admitted on 09/22/2015 with symptoms of dizziness and vomiting, which was determined to be secondary to vertigo. Interventions at this time will be focused on vestibular physical therapy.   Principal Problem:   Vertigo Active Problems:   Subconjunctival hemorrhage  Vertigo - Presented with history of vertigo, tinnitis, and nausea since yesterday morning. He denies syncopal symptoms and has no focal brain stem or neurologic deficits so a central cause of vertigo is less likely. Vertigo has improved with meclizine and vestibular physical therapy and CT and MRI head were negative for acute process. Benign Paroxysmal Positional Vertigo is most likely.   Conjunctival hemorrhage - Most likely a result of his 8 episodes of vomiting at home. He reports no visual field abnormalities.   Dispo: Anticipated discharge today   LOS: 0 days  Ledell Noss, MD 09/23/2015, 11:56 AM Pager: [phone]

## 2015-09-23 NOTE — Discharge Summary (Signed)
Name: Derek Bush MRN: [id number] DOB: 1975-01-31 40 y.o. PCP: No Pcp Per Patient  Date of Admission: 09/22/2015  3:30 AM Date of Discharge: 09/23/2015 Attending Physician: Aldine Contes, MD  Discharge Diagnosis: 1. Vertigo   Presented with dizziness, tinnitis, headache, nausea and vomiting for 1 day   Symptoms resolved with meclizine and vestibular physical therapy    Principal Problem:   Vertigo Active Problems:   Subconjunctival hemorrhage   Discharge Medications:   Medication List    TAKE these medications   acetaminophen 500 MG tablet Commonly known as:  TYLENOL Take 1 tablet (500 mg total) by mouth every 6 (six) hours as needed.   benzonatate 100 MG capsule Commonly known as:  TESSALON Take 1 capsule (100 mg total) by mouth 3 (three) times daily as needed for cough.   docusate sodium 100 MG capsule Commonly known as:  COLACE Take 1 capsule (100 mg total) by mouth every 12 (twelve) hours.   Lidocaine (Anorectal) 5 % Gel Apply 15 mLs topically every 4 (four) hours as needed (pain).   meclizine 25 MG tablet Commonly known as:  ANTIVERT Take 1 tablet (25 mg total) by mouth 3 (three) times daily as needed for dizziness.       Disposition and follow-up:   Mr.Derek Bush was [facility] from [facility] in Stable condition.  At the hospital follow up visit please address:  1. Vertigo - Is meclizine PRN working, if not start a daily Rx. Has he been able to perform vestibular physical therapy exercises at home? Are there any resources we can use to establish outpatient vestibular rehab?   He presented with conjunctival hemorrhage thought to be secondary to vomiting, has this hemorrhage resolved?  Arrhythmia? - please obtain rhythm strip   2.  Labs / imaging needed at time of follow-up: BMET, CBC, EKG   3.  Pending labs/ test needing follow-up: none  Follow-up Appointments: Follow-up Information    [place]. Call today.   Why:  please call and make an appointment first thing monday morning [phone] Contact information: [address] [place] [facility] Course by problem list: Principal Problem:   Vertigo Active Problems:   Subconjunctival hemorrhage   1. Vertigo  Presented with dizziness, tinnitis, headache, nausea and vomiting for 1 day. No syncope, no focal neurologic deficits on exam.  CT and MRI head were negative for acute intracranial hemorrhage, infarction, or mass effect. 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 Symptoms responded well to meclizine, zofran, and vestibular physical therapy. Physical therapy taught him vestibular techniques, outpatient vestibular therapy was held because he is uninsured and this would be a significant out of pocket expense.  On the day of discharge he was feeling much better, he was eating and had not vomited, he was able to ambulate despite the vertigo.   He was discharged with Meclizine PRN prescription and instructed to take one daily if symptoms persisted.   ?Arrhythmia- While in the ED cardiac monitoring seemed to fluctuate between tachycardia and bradycardia. He was admitted to telemetry and no arrhythmia alarms were detected.   Discharge Vitals:   BP 128/75 (BP Location: Left Arm)   Pulse 64   Temp 98 F (36.7 C)   Resp 20   Ht 5\' 10"  (1.778 m)   Wt 220 lb (99.8 kg)   SpO2 96%  BMI 31.57 kg/m   Pertinent Labs, Studies, and Procedures: EKG- normal sinus rhythm with one PVC   Procedures Performed:  Ct Head Wo Contrast  Result Date: 09/22/2015 CLINICAL DATA:  Vertigo, headache and nausea. EXAM: CT HEAD WITHOUT CONTRAST TECHNIQUE: Contiguous axial images were obtained from the base of the skull through the vertex without intravenous contrast. COMPARISON:  None. FINDINGS: BRAIN: The ventricles and sulci are normal. No  intraparenchymal hemorrhage, mass effect nor midline shift. No acute large vascular territory infarcts. No abnormal extra-axial fluid collections. Basal cisterns are patent. VASCULAR: Unremarkable. SKULL/SOFT TISSUES: No skull fracture. No significant soft tissue swelling. ORBITS/SINUSES: The included ocular globes and orbital contents are normal.Trace ethmoid mucosal thickening. Mastoid air cells are well aerated. OTHER: Patient is edentulous. IMPRESSION: Negative CT HEAD. Electronically Signed   By: Elon Alas M.D.   On: 09/22/2015 04:47   [name] [id number] Contrast  Result Date: 09/22/2015 CLINICAL DATA:  Vertigo sudden onset yesterday EXAM: MRI HEAD WITHOUT AND WITH CONTRAST TECHNIQUE: Multiplanar, multiecho pulse sequences of the brain and surrounding structures were obtained without and with intravenous contrast. CONTRAST:  20 mL MultiHance IV COMPARISON:  CT head 09/22/2015 FINDINGS: Image quality degraded by mild motion. Ventricle size normal.  Cerebral volume normal. Negative for acute or chronic infarction. Negative for demyelinating disease. Cerebral white matter normal. Basal ganglia brainstem and cerebellum normal Negative for intracranial hemorrhage. Negative for mass or edema. Normal enhancement following contrast infusion. No enhancing mass lesion. Mild mucosal edema in the paranasal sinuses. Normal orbital structures. Pituitary not enlarged. IMPRESSION: Normal MRI of the brain with contrast Mild mucosal edema in the paranasal sinuses. Electronically Signed   By: Franchot Gallo M.D.   On: 09/22/2015 12:00   Consultations: Neurology- Dr. Tasia Catchings   Discharge Instructions: Discharge Instructions    Call MD for:  difficulty breathing, headache or visual disturbances    Complete by:  As directed   Call MD for:  persistant dizziness or light-headedness    Complete by:  As directed   Call MD for:  persistant nausea and vomiting    Complete by:  As directed   Diet - low sodium heart  healthy    Complete by:  As directed   Increase activity slowly    Complete by:  As directed      Signed: Ledell Noss, MD 09/23/2015, 11:50 AM   Pager: [phone]

## 2015-09-23 NOTE — Progress Notes (Signed)
Subjective:  Lisle is doing well this morning he is no longer experiencing the nausea and vomiting that he had yesterday. He notes a vertiginous sensation associated with changes in head position.  Exam: Vitals:   09/22/15 2217 09/23/15 0530  BP: 130/65 128/75  Pulse: 60 64  Resp: 18 20  Temp: 98.2 F (36.8 C) 98 F (36.7 C)    HEENT-  Normocephalic, no lesions, without obvious abnormality.  Normal external eye and conjunctiva.  Normal TM's bilaterally.  Normal auditory canals and external ears. Normal external nose, mucus membranes and septum.  Normal pharynx. Cardiovascular- regular rate and rhythm, S1, S2 normal, no murmur, click, rub or gallop, pulses palpable throughout   Lungs- chest clear, no wheezing, rales, normal symmetric air entry, Heart exam - S1, S2 normal, no murmur, no gallop, rate regular Abdomen- soft, non-tender; bowel sounds normal; no masses,  no organomegaly Extremities- less then 2 second capillary refill   Gen: In bed, NAD MS: Alert and oriented 3 CN: Pupils are equal round reactive to light and accommodation. Extraocular movements reveal right going nystagmus with gaze deviation to the left Motor: 5 over 5 bilaterally. Sensory: Intact. DTR: 1-2+ bilaterally  Pertinent Labs/Diagnostics: Reviewed.   Impression:   Brann is a 40 year old gentleman who presented with a vertiginous sensation associated with nausea and vomiting. His neurologic exam is otherwise normal. His MRI was also normal. Today he has some right going nystagmus with gaze deviation to the left. The presentation is suggestive of vestibular dysfunction. Today he reports turning his head to the left triggers his symptoms. This is suggestive of benign positional vertigo. Physical therapy for canalith repositioning will be requested.   Recommendations:  1. Physical therapy for vestibular maneuvers will be requested.  2. Neurology will sign off at this point please reconsult with any new  issues.   Selby Foisy A. Tasia Catchings, M.D. Neurohospitalist Phone: [phone]   09/23/2015, 8:05 AM

## 2015-09-23 NOTE — Discharge Instructions (Signed)
Thank you for trusting [place] with your medical care!  You were hospitalized for benign paroxysmal positional vertigo (dizziness) and treated with antinausea medications.   Please take note of the following changes to your medications: Start taking Meclizine 25mg  up to three times daily as needed for dizziness   To make sure you are getting better, please make it to the follow-up appointments listed on the first page.  If you have any questions, please call [phone].

## 2016-06-11 ENCOUNTER — Encounter (HOSPITAL_COMMUNITY): Payer: Self-pay | Admitting: *Deleted

## 2016-06-11 ENCOUNTER — Emergency Department (HOSPITAL_COMMUNITY)
Admission: EM | Admit: 2016-06-11 | Discharge: 2016-06-12 | Disposition: A | Discharge status: Home / Self Care | Payer: Self-pay | Attending: Emergency Medicine | Admitting: Emergency Medicine

## 2016-06-11 ENCOUNTER — Emergency Department (HOSPITAL_COMMUNITY): Payer: Self-pay

## 2016-06-11 DIAGNOSIS — Y92410 Unspecified street and highway as the place of occurrence of the external cause: Secondary | ICD-10-CM | POA: Insufficient documentation

## 2016-06-11 DIAGNOSIS — F17210 Nicotine dependence, cigarettes, uncomplicated: Secondary | ICD-10-CM | POA: Insufficient documentation

## 2016-06-11 DIAGNOSIS — S70311A Abrasion, right thigh, initial encounter: Secondary | ICD-10-CM | POA: Insufficient documentation

## 2016-06-11 DIAGNOSIS — J45909 Unspecified asthma, uncomplicated: Secondary | ICD-10-CM | POA: Insufficient documentation

## 2016-06-11 DIAGNOSIS — S93601A Unspecified sprain of right foot, initial encounter: Secondary | ICD-10-CM | POA: Insufficient documentation

## 2016-06-11 DIAGNOSIS — Y939 Activity, unspecified: Secondary | ICD-10-CM | POA: Insufficient documentation

## 2016-06-11 DIAGNOSIS — Y999 Unspecified external cause status: Secondary | ICD-10-CM | POA: Insufficient documentation

## 2016-06-11 DIAGNOSIS — S52131A Displaced fracture of neck of right radius, initial encounter for closed fracture: Secondary | ICD-10-CM | POA: Insufficient documentation

## 2016-06-11 MED ORDER — OXYCODONE-ACETAMINOPHEN 5-325 MG PO TABS
ORAL_TABLET | ORAL | Status: AC
  Administered 2016-06-12: 1 via ORAL
  Filled 2016-06-11: qty 1

## 2016-06-11 MED ORDER — OXYCODONE-ACETAMINOPHEN 5-325 MG PO TABS
1.0000 | ORAL_TABLET | ORAL | Status: AC | PRN
  Administered 2016-06-11 – 2016-06-12 (×2): 1 via ORAL
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
## 2016-06-12 ENCOUNTER — Emergency Department (HOSPITAL_COMMUNITY): Payer: Self-pay

## 2016-06-12 MED ORDER — IBUPROFEN 800 MG PO TABS: 800.0000 mg | ORAL_TABLET | Freq: Three times a day (TID) | ORAL | 0 refills | Status: DC

## 2016-06-12 MED ORDER — HYDROCODONE-ACETAMINOPHEN 5-325 MG PO TABS: 1.0000 | ORAL_TABLET | Freq: Four times a day (QID) | ORAL | 0 refills | Status: AC | PRN
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
## 2016-06-12 NOTE — Progress Notes (Signed)
Orthopedic Tech Progress Note Patient Details:  [facility] 1976/01/25 [id number]  Ortho Devices Type of Ortho Device: Arm sling, Post (long arm) splint Ortho Device/Splint Location: rue Ortho Device/Splint Interventions: Ordered, Application   Karolee Stamps 06/12/2016, 2:43 AM
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
## 2017-02-18 ENCOUNTER — Encounter (HOSPITAL_COMMUNITY): Payer: Self-pay | Admitting: Emergency Medicine

## 2017-02-18 ENCOUNTER — Emergency Department (HOSPITAL_COMMUNITY)
Admission: EM | Admit: 2017-02-18 | Discharge: 2017-02-18 | Disposition: A | Discharge status: Home / Self Care | Payer: Self-pay | Attending: Emergency Medicine | Admitting: Emergency Medicine

## 2017-02-18 ENCOUNTER — Emergency Department (HOSPITAL_COMMUNITY): Payer: Self-pay

## 2017-02-18 ENCOUNTER — Other Ambulatory Visit: Payer: Self-pay

## 2017-02-18 DIAGNOSIS — R05 Cough: Secondary | ICD-10-CM

## 2017-02-18 DIAGNOSIS — R059 Cough, unspecified: Secondary | ICD-10-CM

## 2017-02-18 DIAGNOSIS — J40 Bronchitis, not specified as acute or chronic: Secondary | ICD-10-CM | POA: Insufficient documentation

## 2017-02-18 DIAGNOSIS — F17210 Nicotine dependence, cigarettes, uncomplicated: Secondary | ICD-10-CM | POA: Insufficient documentation

## 2017-02-18 DIAGNOSIS — R509 Fever, unspecified: Secondary | ICD-10-CM

## 2017-02-18 LAB — I-STAT CG4 LACTIC ACID, ED: LACTIC ACID, VENOUS: 1.56 mmol/L (ref 0.5–1.9)

## 2017-02-18 LAB — BASIC METABOLIC PANEL
ANION GAP: 12 (ref 5–15)
BUN: 7 mg/dL (ref 6–20)
CHLORIDE: 102 mmol/L (ref 101–111)
CO2: 27 mmol/L (ref 22–32)
CREATININE: 1.24 mg/dL (ref 0.61–1.24)
Calcium: 9.2 mg/dL (ref 8.9–10.3)
GFR calc Af Amer: >60 mL/min (ref >60)
GFR calc non Af Amer: >60 mL/min (ref >60)
Glucose, Bld: 114 mg/dL — ABNORMAL HIGH (ref 65–99)
POTASSIUM: 3.4 mmol/L — AB (ref 3.5–5.1)
SODIUM: 141 mmol/L (ref 135–145)

## 2017-02-18 LAB — URINALYSIS, ROUTINE W REFLEX MICROSCOPIC
GLUCOSE, UA: NEGATIVE mg/dL
HGB URINE DIPSTICK: NEGATIVE
Ketones, ur: NEGATIVE mg/dL
Leukocytes, UA: NEGATIVE
NITRITE: NEGATIVE
PH: 8 (ref 5.0–8.0)
Protein, ur: 100 mg/dL — AB
Specific Gravity, Urine: 1.034 — ABNORMAL HIGH (ref 1.005–1.030)
Squamous Epithelial / LPF: NONE SEEN

## 2017-02-18 LAB — CBC
HCT: 45.1 % (ref 39.0–52.0)
HEMOGLOBIN: 14.7 g/dL (ref 13.0–17.0)
MCH: 23.6 pg — AB (ref 26.0–34.0)
MCHC: 32.6 g/dL (ref 30.0–36.0)
MCV: 72.5 fL — AB (ref 78.0–100.0)
Platelets: 225 10*3/uL (ref 150–400)
RBC: 6.22 MIL/uL — AB (ref 4.22–5.81)
RDW: 14.5 % (ref 11.5–15.5)
WBC: 6.7 10*3/uL (ref 4.0–10.5)

## 2017-02-18 LAB — I-STAT TROPONIN, ED: TROPONIN I, POC: 0.01 ng/mL (ref 0.00–0.08)

## 2017-02-18 LAB — LIPASE, BLOOD: LIPASE: 33 U/L (ref 11–51)

## 2017-02-18 MED ORDER — ALBUTEROL SULFATE HFA 108 (90 BASE) MCG/ACT IN AERS: 1.0000 | INHALATION_SPRAY | Freq: Four times a day (QID) | RESPIRATORY_TRACT | 0 refills | Status: AC | PRN

## 2017-02-18 MED ORDER — AZITHROMYCIN 250 MG PO TABS: 250.0000 mg | ORAL_TABLET | Freq: Every day | ORAL | 0 refills | Status: AC

## 2017-02-18 MED ORDER — PREDNISONE 20 MG PO TABS: ORAL_TABLET | ORAL | 0 refills | Status: DC

## 2017-02-18 MED ORDER — ACETAMINOPHEN 325 MG PO TABS
650.0000 mg | ORAL_TABLET | Freq: Once | ORAL | Status: AC | PRN
  Administered 2017-02-18: 650 mg via ORAL
  Filled 2017-02-18: qty 2

## 2017-02-18 MED ORDER — IBUPROFEN 800 MG PO TABS
800.0000 mg | ORAL_TABLET | Freq: Once | ORAL | Status: AC
Start: 2017-02-18 — End: 2017-02-18
  Administered 2017-02-18: 800 mg via ORAL
  Filled 2017-02-18: qty 1

## 2017-02-18 MED ORDER — ONDANSETRON HCL 4 MG/2ML IJ SOLN
4.0000 mg | Freq: Once | INTRAMUSCULAR | Status: AC
  Administered 2017-02-18: 4 mg via INTRAVENOUS
  Filled 2017-02-18: qty 2

## 2017-02-18 MED ORDER — BENZONATATE 100 MG PO CAPS: 100.0000 mg | ORAL_CAPSULE | Freq: Three times a day (TID) | ORAL | 0 refills | Status: AC

## 2017-02-18 MED ORDER — SODIUM CHLORIDE 0.9 % IV BOLUS (SEPSIS)
1000.0000 mL | Freq: Once | INTRAVENOUS | Status: AC
  Administered 2017-02-18: 1000 mL via INTRAVENOUS

## 2017-02-18 NOTE — Discharge Instructions (Signed)
Take the prescribed medication as directed. Make sure to drink fluids and stay hydrated. Follow-up with your primary care doctor. Return to the ED for new or worsening symptoms.

## 2017-02-18 NOTE — ED Triage Notes (Signed)
Pt to ER reports one day nausea and vomiting with fever. While speaking with patient he reports chest pain to the left described as "congested." pt appears weak/fatigued. Tachycardic in triage. Pt is poor historian.

## 2017-02-18 NOTE — ED Provider Notes (Signed)
[facility] EMERGENCY DEPARTMENT Provider Note   CSN: [id number] Arrival date & time: 02/18/17  1551     History   Chief Complaint Chief Complaint  Patient presents with  . Nausea  . Emesis  . Weakness    HPI Derek Bush is a 42 y.o. male.  The history is provided by the patient and medical records.  Emesis   Associated symptoms include cough.  Weakness  Associated symptoms include vomiting.     43 y.o. M with hx of asthma, presenting to the ED with nausea, vomiting, generalized weakness, and cough.  When I ask patient to elaborate on symptoms he just replies "I'm sick".  States he has been feeling this way for 2 days.  Denies sick contacts.  Did not get a flu shot.  Reports fever at home up to 101F.  Did try taking some OTC cold medication but states he felt like it actually made his symptoms worse so he stopped taking them.  He reports "chest congestion" but denies chest pain.  Cough has been dry.  No cardiac history.  Denies abdominal pain.  Has had poor appetite today.  Past Medical History:  Diagnosis Date  . Asthma     Patient Active Problem List   Diagnosis Date Noted  . Vertigo 09/22/2015  . Subconjunctival hemorrhage 09/22/2015    Past Surgical History:  Procedure Laterality Date  . KNEE SURGERY         Home Medications    Prior to Admission medications   Medication Sig Start Date End Date Taking? Authorizing Provider  acetaminophen (TYLENOL) 500 MG tablet Take 1 tablet (500 mg total) by mouth every 6 (six) hours as needed. Patient not taking: Reported on 09/22/2015 12/06/14   Gloriann Loan, PA-C  benzonatate (TESSALON) 100 MG capsule Take 1 capsule (100 mg total) by mouth 3 (three) times daily as needed for cough. Patient not taking: Reported on 09/22/2015 12/06/14   Gloriann Loan, PA-C  docusate sodium (COLACE) 100 MG capsule Take 1 capsule (100 mg total) by mouth every 12 (twelve) hours. Patient not taking: Reported on 09/22/2015  07/25/12   Antonietta Breach, PA-C  HYDROcodone-acetaminophen (NORCO/VICODIN) 5-325 MG tablet Take 1-2 tablets by mouth every 6 (six) hours as needed. 06/12/16   Montine Circle, PA-C  ibuprofen (ADVIL,MOTRIN) 800 MG tablet Take 1 tablet (800 mg total) by mouth 3 (three) times daily. 06/12/16   Montine Circle, PA-C  Lidocaine, Anorectal, 5 % GEL Apply 15 mLs topically every 4 (four) hours as needed (pain). Patient not taking: Reported on 09/22/2015 07/25/12   Antonietta Breach, PA-C  meclizine (ANTIVERT) 25 MG tablet Take 1 tablet (25 mg total) by mouth 3 (three) times daily as needed for dizziness. 09/23/15   Ledell Noss, MD    Family History History reviewed. No pertinent family history.  Social History Social History   Tobacco Use  . Smoking status: Current Every Day Smoker    Packs/day: 0.50    Types: Cigarettes  . Smokeless tobacco: Never Used  Substance Use Topics  . Alcohol use: No  . Drug use: No     Allergies   Patient has no known allergies.   Review of Systems Review of Systems  Respiratory: Positive for cough.   Gastrointestinal: Positive for nausea and vomiting.  Neurological: Positive for weakness.  All other systems reviewed and are negative.    Physical Exam Updated Vital Signs BP 116/80   Pulse (!) 119   Temp (!) 102 F (  38.9 C) (Oral)   Resp (!) 22   SpO2 93%   Physical Exam  Constitutional: He is oriented to person, place, and time. He appears well-developed and well-nourished.  Not participating in exam very well, poor historian  HENT:  Head: Normocephalic and atraumatic.  Mouth/Throat: Oropharynx is clear and moist.  Appears dry  Eyes: Conjunctivae and EOM are normal. Pupils are equal, round, and reactive to light.  Neck: Normal range of motion.  Cardiovascular: Regular rhythm and normal heart sounds. Tachycardia present.  Pulmonary/Chest: Effort normal and breath sounds normal. No stridor. No respiratory distress.  Abdominal: Soft. Bowel sounds are  normal. There is no tenderness. There is no rebound.  Musculoskeletal: Normal range of motion.  Neurological: He is alert and oriented to person, place, and time.  Skin: Skin is warm and dry.  Nursing note and vitals reviewed.    ED Treatments / Results  Labs (all labs ordered are listed, but only abnormal results are displayed) Labs Reviewed  BASIC METABOLIC PANEL - Abnormal; Notable for the following components:      Result Value   Potassium 3.4 (*)    Glucose, Bld 114 (*)    All other components within normal limits  CBC - Abnormal; Notable for the following components:   RBC 6.22 (*)    MCV 72.5 (*)    MCH 23.6 (*)    All other components within normal limits  URINALYSIS, ROUTINE W REFLEX MICROSCOPIC - Abnormal; Notable for the following components:   APPearance HAZY (*)    Specific Gravity, Urine 1.034 (*)    Bilirubin Urine SMALL (*)    Protein, ur 100 (*)    Bacteria, UA RARE (*)    All other components within normal limits  LIPASE, BLOOD  I-STAT TROPONIN, ED  I-STAT CG4 LACTIC ACID, ED    EKG  EKG Interpretation None       Radiology Dg Chest 2 View  Result Date: 02/18/2017 CLINICAL DATA:  Chest pain for the past day. Fever. Nausea and vomiting. EXAM: CHEST  2 VIEW COMPARISON:  12/06/2014; 01/21/2011 FINDINGS: Grossly unchanged cardiac silhouette and mediastinal contours. Minimal bilateral perihilar interstitial opacities. No discrete focal airspace opacities. No pleural effusion or pneumothorax. No evidence of edema. No acute osseus abnormalities. Mild scoliotic curvature of the thoracolumbar spine, unchanged. IMPRESSION: Findings suggestive of airways disease / bronchitis. No focal airspace opacities to suggest pneumonia. Electronically Signed   By: Sandi Mariscal M.D.   On: 02/18/2017 17:04    Procedures Procedures (including critical care time)  Medications Ordered in ED Medications  acetaminophen (TYLENOL) tablet 650 mg (650 mg Oral Given 02/18/17 1923)    sodium chloride 0.9 % bolus 1,000 mL (0 mLs Intravenous Stopped 02/18/17 2300)  ondansetron (ZOFRAN) injection 4 mg (4 mg Intravenous Given 02/18/17 2101)  ibuprofen (ADVIL,MOTRIN) tablet 800 mg (800 mg Oral Given 02/18/17 2101)     Initial Impression / Assessment and Plan / ED Course  I have reviewed the triage vital signs and the nursing notes.  Pertinent labs & imaging results that were available during my care of the patient were reviewed by me and considered in my medical decision making (see chart for details).  42 year old male here with cough, fever, nausea, and vomiting.  When asked to explain further he just states "I am sick".  He is febrile and nontoxic in appearance but does appear ill.  He is in no acute distress.  Lungs are overall clear.  Workup completed in the waiting  room, overall reassuring.  Chest x-ray does reveal some bronchitic changes.  Patient does have history of asthma and states he is prone to pneumonia.  He remains tachycardic and appears somewhat dry on exam.  Will give IV fluids, additional medications for fever, as well as antiemetics and reassess.  After medications and fluids, patient's heart rate has returned to within normal limits.  His fever has resolved.  He appears improved.  Feel he is stable for discharge.  I suspect his symptoms are likely viral in nature, however given his reported tendency for pneumonia will start on azithromycin as well.  Close follow-up with PCP.  Discussed plan with patient, he acknowledged understanding and agreed with plan of care.  Return precautions given for new or worsening symptoms.  Final Clinical Impressions(s) / ED Diagnoses   Final diagnoses:  Bronchitis  Cough  Fever, unspecified fever cause    ED Discharge Orders        Ordered    azithromycin (ZITHROMAX) 250 MG tablet  Daily     02/18/17 2306    predniSONE (DELTASONE) 20 MG tablet     02/18/17 2306    benzonatate (TESSALON) 100 MG capsule  Every 8 hours      02/18/17 2306    albuterol (PROVENTIL HFA;VENTOLIN HFA) 108 (90 Base) MCG/ACT inhaler  Every 6 hours PRN     02/18/17 2306       Larene Pickett, PA-C 02/18/17 [place], [place], MD 02/19/17 [phone]

## 2017-02-18 NOTE — ED Notes (Signed)
Lab work, radiology results and vital signs reviewed, no critical results at this time, no change in acuity indicated. Awaiting lactic result.

## 2017-09-28 ENCOUNTER — Encounter (HOSPITAL_COMMUNITY): Payer: Self-pay | Admitting: *Deleted

## 2017-09-28 ENCOUNTER — Emergency Department (HOSPITAL_COMMUNITY): Payer: Self-pay

## 2017-09-28 ENCOUNTER — Emergency Department (HOSPITAL_COMMUNITY)
Admission: EM | Admit: 2017-09-28 | Discharge: 2017-09-28 | Disposition: A | Discharge status: Home / Self Care | Payer: Self-pay | Attending: Emergency Medicine | Admitting: Emergency Medicine

## 2017-09-28 ENCOUNTER — Other Ambulatory Visit: Payer: Self-pay

## 2017-09-28 DIAGNOSIS — J069 Acute upper respiratory infection, unspecified: Secondary | ICD-10-CM

## 2017-09-28 DIAGNOSIS — Z79899 Other long term (current) drug therapy: Secondary | ICD-10-CM | POA: Insufficient documentation

## 2017-09-28 DIAGNOSIS — R062 Wheezing: Secondary | ICD-10-CM

## 2017-09-28 DIAGNOSIS — B9789 Other viral agents as the cause of diseases classified elsewhere: Secondary | ICD-10-CM | POA: Insufficient documentation

## 2017-09-28 DIAGNOSIS — F17210 Nicotine dependence, cigarettes, uncomplicated: Secondary | ICD-10-CM | POA: Insufficient documentation

## 2017-09-28 DIAGNOSIS — Z720 Tobacco use: Secondary | ICD-10-CM

## 2017-09-28 MED ORDER — PREDNISONE 10 MG (21) PO TBPK: ORAL_TABLET | ORAL | 0 refills | Status: AC

## 2017-09-28 MED ORDER — ACETAMINOPHEN 325 MG PO TABS
650.0000 mg | ORAL_TABLET | Freq: Once | ORAL | Status: AC | PRN
  Administered 2017-09-28: 650 mg via ORAL
  Filled 2017-09-28: qty 2

## 2017-09-28 MED ORDER — IPRATROPIUM-ALBUTEROL 0.5-2.5 (3) MG/3ML IN SOLN
3.0000 mL | Freq: Once | RESPIRATORY_TRACT | Status: AC
  Administered 2017-09-28: 3 mL via RESPIRATORY_TRACT
  Filled 2017-09-28: qty 3

## 2017-09-28 MED ORDER — IBUPROFEN 800 MG PO TABS: 800.0000 mg | ORAL_TABLET | Freq: Once | ORAL | Status: DC

## 2017-09-28 MED ORDER — ALBUTEROL SULFATE HFA 108 (90 BASE) MCG/ACT IN AERS
2.0000 | INHALATION_SPRAY | Freq: Once | RESPIRATORY_TRACT | Status: AC
  Administered 2017-09-28: 2 via RESPIRATORY_TRACT
  Filled 2017-09-28: qty 6.7

## 2017-09-28 MED ORDER — PREDNISONE 20 MG PO TABS
60.0000 mg | ORAL_TABLET | Freq: Once | ORAL | Status: AC
  Administered 2017-09-28: 60 mg via ORAL
  Filled 2017-09-28: qty 3

## 2017-09-28 NOTE — ED Triage Notes (Signed)
Pt is here for being sick.  He states that he thinks he has a sinus infection.  Pt reports URI with cough and congestion x2 days. Pt took "allergy meds" and thera flu at home without relief.

## 2017-09-28 NOTE — Discharge Instructions (Addendum)
You appear to have an upper respiratory infection (URI). An upper respiratory tract infection, or cold, is a viral infection of the air passages leading to the lungs. It is contagious and can be spread to others, especially during the first 3 or 4 days. It cannot be cured by antibiotics or other medicines. Take over the counter sinus/cold/flu medications such as dayquil/nyquil. Avoid smoking cigarettes. You may take motrin in between doses of cold medicine to control headaches/ body aches and chills.   Take 2 puffs of your inhaler ever 4 hours as needed for wheezing.  RETURN IMMEDIATELY IF you develop shortness of breath, confusion or altered mental status, a new rash, become dizzy, faint, or poorly responsive, or are unable to be cared for at home.

## 2017-09-28 NOTE — ED Provider Notes (Signed)
[facility] EMERGENCY DEPARTMENT Provider Note   CSN: [id number] Arrival date & time: 09/28/17  1544     History   Chief Complaint Chief Complaint  Patient presents with  . URI    HPI Derek Bush is a 42 y.o. male who presents emergency department with chief complaint of URI symptoms.  Patient claims of 2 days of headaches, nasal congestion, sinus pressure, cough and wheezing.  He has had associated body aches, malaise.  He is a daily smoker.  Upon arrival he was noted to be borderline febrile and somewhat tachycardic which resolved with Tylenol and oral fluids.  He had just taken loratadine, Mucinex and his wife gave him a doxycycline at home without significant improvement in his symptoms.  HPI  Past Medical History:  Diagnosis Date  . Asthma     Patient Active Problem List   Diagnosis Date Noted  . Vertigo 09/22/2015  . Subconjunctival hemorrhage 09/22/2015    Past Surgical History:  Procedure Laterality Date  . KNEE SURGERY          Home Medications    Prior to Admission medications   Medication Sig Start Date End Date Taking? Authorizing Provider  acetaminophen (TYLENOL) 500 MG tablet Take 1 tablet (500 mg total) by mouth every 6 (six) hours as needed. Patient not taking: Reported on 09/22/2015 12/06/14   Gloriann Loan, PA-C  albuterol (PROVENTIL HFA;VENTOLIN HFA) [phone] Base) MCG/ACT inhaler Inhale 1-2 puffs into the lungs every 6 (six) hours as needed for wheezing. 02/18/17   Larene Pickett, PA-C  azithromycin (ZITHROMAX) 250 MG tablet Take 1 tablet (250 mg total) by mouth daily. Take first 2 tablets together, then 1 every day until finished. 02/18/17   Larene Pickett, PA-C  benzonatate (TESSALON) 100 MG capsule Take 1 capsule (100 mg total) by mouth every 8 (eight) hours. 02/18/17   Larene Pickett, PA-C  docusate sodium (COLACE) 100 MG capsule Take 1 capsule (100 mg total) by mouth every 12 (twelve) hours. Patient not taking: Reported on  09/22/2015 07/25/12   Antonietta Breach, PA-C  HYDROcodone-acetaminophen (NORCO/VICODIN) 5-325 MG tablet Take 1-2 tablets by mouth every 6 (six) hours as needed. 06/12/16   Montine Circle, PA-C  ibuprofen (ADVIL,MOTRIN) 800 MG tablet Take 1 tablet (800 mg total) by mouth 3 (three) times daily. 06/12/16   Montine Circle, PA-C  Lidocaine, Anorectal, 5 % GEL Apply 15 mLs topically every 4 (four) hours as needed (pain). Patient not taking: Reported on 09/22/2015 07/25/12   Antonietta Breach, PA-C  meclizine (ANTIVERT) 25 MG tablet Take 1 tablet (25 mg total) by mouth 3 (three) times daily as needed for dizziness. 09/23/15   Ledell Noss, MD  predniSONE (DELTASONE) 20 MG tablet Take 40 mg by mouth daily for 3 days, then 20mg  by mouth daily for 3 days, then 10mg  daily for 3 days 02/18/17   Larene Pickett, PA-C    Family History No family history on file.  Social History Social History   Tobacco Use  . Smoking status: Current Every Day Smoker    Packs/day: 0.50    Types: Cigarettes  . Smokeless tobacco: Never Used  Substance Use Topics  . Alcohol use: No  . Drug use: No     Allergies   Patient has no known allergies.   Review of Systems Review of Systems   Physical Exam Updated Vital Signs BP (!) 128/91   Pulse 92   Temp 98.5 F (36.9 C) (Oral)  Resp 16   Ht 5\' 10"  (1.778 m)   Wt 104.3 kg   SpO2 97%   BMI 33.00 kg/m   Physical Exam  Constitutional: He appears well-developed and well-nourished. No distress.  HENT:  Head: Normocephalic and atraumatic.  Eyes: Pupils are equal, round, and reactive to light. Conjunctivae and EOM are normal. No scleral icterus.  Neck: Normal range of motion. Neck supple.  Cardiovascular: Normal rate, regular rhythm and normal heart sounds.  Pulmonary/Chest: Effort normal. No respiratory distress. He has wheezes.  Expiratory wheezes  Abdominal: Soft. There is no tenderness.  Musculoskeletal: He exhibits no edema.  Neurological: He is alert. Cranial  nerve deficit:   Skin: Skin is warm and dry. He is not diaphoretic.  Psychiatric: His behavior is normal.  Nursing note and vitals reviewed.    ED Treatments / Results  Labs (all labs ordered are listed, but only abnormal results are displayed) Labs Reviewed - No data to display  EKG None  Radiology Dg Chest 2 View  Result Date: 09/28/2017 CLINICAL DATA:  Patient with upper respiratory tract infection. EXAM: CHEST - 2 VIEW COMPARISON:  Chest radiograph 02/18/2017 FINDINGS: Stable cardiac and mediastinal contours. No consolidative pulmonary opacities. No pleural effusion or pneumothorax. Thoracic spine degenerative changes. IMPRESSION: No acute cardiopulmonary process. Electronically Signed   By: Lovey Newcomer M.D.   On: 09/28/2017 17:47    Procedures Procedures (including critical care time)  Medications Ordered in ED Medications  ibuprofen (ADVIL,MOTRIN) tablet 800 mg (800 mg Oral Not Given 09/28/17 1710)  predniSONE (DELTASONE) tablet 60 mg (has no administration in time range)  acetaminophen (TYLENOL) tablet 650 mg (650 mg Oral Given 09/28/17 1612)  ipratropium-albuterol (DUONEB) 0.5-2.5 (3) MG/3ML nebulizer solution 3 mL (3 mLs Nebulization Given 09/28/17 1724)     Initial Impression / Assessment and Plan / ED Course  I have reviewed the triage vital signs and the nursing notes.  Pertinent labs & imaging results that were available during my care of the patient were reviewed by me and considered in my medical decision making (see chart for details).     Treated in the emergency department with a DuoNeb and oral prednisone with improvement in his wheezing.  He was given an inhaler.  Patient will be started on a Sterapred Dosepak and is encouraged to take over-the-counter cold medicines and avoid smoking cigarettes. The patient was counseled on the dangers of tobacco use, and was advised to quit.  Reviewed strategies to maximize success, including removing cigarettes and smoking  materials from environment, stress management, substitution of other forms of reinforcement and support of family/friends.  Patient advised that antibiotics do not treat viral URIs.  I reviewed his chest x-ray PA and lateral films personally and do not see any evidence of pneumonia or consolidation.  Patient is improved with normalized vital signs and appears appropriate for discharge at this time.  He may follow-up with a primary care physician in the next 2 days or return for worsening symptoms.   Final Clinical Impressions(s) / ED Diagnoses   Final diagnoses:  URI (upper respiratory infection)    ED Discharge Orders    None       Margarita Mail, PA-C 09/28/17 1810    Sherwood Gambler, MD 09/29/17 [phone]

## 2017-10-20 ENCOUNTER — Emergency Department
Admission: EM | Admit: 2017-10-20 | Discharge: 2017-10-20 | Disposition: A | Discharge status: Home / Self Care | Payer: No Typology Code available for payment source | Attending: Emergency Medicine | Admitting: Emergency Medicine

## 2017-10-20 ENCOUNTER — Emergency Department: Payer: No Typology Code available for payment source

## 2017-10-20 ENCOUNTER — Encounter: Payer: Self-pay | Admitting: Emergency Medicine

## 2017-10-20 ENCOUNTER — Other Ambulatory Visit: Payer: Self-pay

## 2017-10-20 DIAGNOSIS — M542 Cervicalgia: Secondary | ICD-10-CM | POA: Diagnosis not present

## 2017-10-20 DIAGNOSIS — Y9389 Activity, other specified: Secondary | ICD-10-CM | POA: Diagnosis not present

## 2017-10-20 DIAGNOSIS — S0990XA Unspecified injury of head, initial encounter: Secondary | ICD-10-CM | POA: Diagnosis not present

## 2017-10-20 DIAGNOSIS — M25572 Pain in left ankle and joints of left foot: Secondary | ICD-10-CM | POA: Insufficient documentation

## 2017-10-20 DIAGNOSIS — W01198A Fall on same level from slipping, tripping and stumbling with subsequent striking against other object, initial encounter: Secondary | ICD-10-CM | POA: Insufficient documentation

## 2017-10-20 DIAGNOSIS — Y9289 Other specified places as the place of occurrence of the external cause: Secondary | ICD-10-CM | POA: Insufficient documentation

## 2017-10-20 DIAGNOSIS — F17210 Nicotine dependence, cigarettes, uncomplicated: Secondary | ICD-10-CM | POA: Diagnosis not present

## 2017-10-20 DIAGNOSIS — J45909 Unspecified asthma, uncomplicated: Secondary | ICD-10-CM | POA: Diagnosis not present

## 2017-10-20 DIAGNOSIS — Y990 Civilian activity done for income or pay: Secondary | ICD-10-CM | POA: Diagnosis not present

## 2017-10-20 MED ORDER — TRAMADOL HCL 50 MG PO TABS: 50.0000 mg | ORAL_TABLET | Freq: Four times a day (QID) | ORAL | 0 refills | Status: AC | PRN

## 2017-10-20 MED ORDER — IBUPROFEN 600 MG PO TABS: 600.0000 mg | ORAL_TABLET | Freq: Four times a day (QID) | ORAL | 0 refills | Status: AC | PRN

## 2017-10-20 NOTE — ED Triage Notes (Signed)
Brought in via ems s/p fall  States he slipped in freezer at work

## 2017-10-20 NOTE — ED Notes (Signed)
See triage note  States he slipped in pudding in freezer    Went face first into a metal pole  Complains of headache and left ankle discomfort  Denies any LOC

## 2017-10-20 NOTE — ED Notes (Signed)

## 2017-10-21 NOTE — ED Provider Notes (Signed)
[facility] Emergency Department Provider Note  ____________________________________________  Time seen: Approximately 7:23 AM  I have reviewed the triage vital signs and the nursing notes.   HISTORY  Chief Complaint Fall    HPI Derek Bush is a 42 y.o. male presents emergency department for evaluation of headache after head injury at work.  Patient states that he slipped on some pudding and hit his forehead on a metal pole.  He did not lose consciousness.  His head has felt "heavy" and has had some neck soreness since.  His ankle has felt somewhat sore since injury, but he is not concerned that anything is broken.  He has not had any difficulty walking on ankle.  He he denies visual changes, dizziness, shortness of breath, chest pain.  Past Medical History:  Diagnosis Date  . Asthma     Patient Active Problem List   Diagnosis Date Noted  . Vertigo 09/22/2015  . Subconjunctival hemorrhage 09/22/2015    Past Surgical History:  Procedure Laterality Date  . KNEE SURGERY      Prior to Admission medications   Medication Sig Start Date End Date Taking? Authorizing Provider  acetaminophen (TYLENOL) 500 MG tablet Take 1 tablet (500 mg total) by mouth every 6 (six) hours as needed. Patient not taking: Reported on 09/22/2015 12/06/14   Gloriann Loan, PA-C  albuterol (PROVENTIL HFA;VENTOLIN HFA) [phone] Base) MCG/ACT inhaler Inhale 1-2 puffs into the lungs every 6 (six) hours as needed for wheezing. 02/18/17   Larene Pickett, PA-C  azithromycin (ZITHROMAX) 250 MG tablet Take 1 tablet (250 mg total) by mouth daily. Take first 2 tablets together, then 1 every day until finished. 02/18/17   Larene Pickett, PA-C  benzonatate (TESSALON) 100 MG capsule Take 1 capsule (100 mg total) by mouth every 8 (eight) hours. 02/18/17   Larene Pickett, PA-C  docusate sodium (COLACE) 100 MG capsule Take 1 capsule (100 mg total) by mouth every 12 (twelve) hours. Patient not  taking: Reported on 09/22/2015 07/25/12   Antonietta Breach, PA-C  HYDROcodone-acetaminophen (NORCO/VICODIN) 5-325 MG tablet Take 1-2 tablets by mouth every 6 (six) hours as needed. 06/12/16   Montine Circle, PA-C  ibuprofen (ADVIL,MOTRIN) 600 MG tablet Take 1 tablet (600 mg total) by mouth every 6 (six) hours as needed. 10/20/17   Laban Emperor, PA-C  Lidocaine, Anorectal, 5 % GEL Apply 15 mLs topically every 4 (four) hours as needed (pain). Patient not taking: Reported on 09/22/2015 07/25/12   Antonietta Breach, PA-C  meclizine (ANTIVERT) 25 MG tablet Take 1 tablet (25 mg total) by mouth 3 (three) times daily as needed for dizziness. 09/23/15   Ledell Noss, MD  predniSONE (STERAPRED UNI-PAK 21 TAB) 10 MG (21) TBPK tablet Use as directed 09/28/17   Margarita Mail, PA-C  traMADol (ULTRAM) 50 MG tablet Take 1 tablet (50 mg total) by mouth every 6 (six) hours as needed. 10/20/17 10/20/18  Laban Emperor, PA-C    Allergies Patient has no known allergies.  No family history on file.  Social History Social History   Tobacco Use  . Smoking status: Current Every Day Smoker    Packs/day: 0.50    Types: Cigarettes  . Smokeless tobacco: Never Used  Substance Use Topics  . Alcohol use: No  . Drug use: No     Review of Systems  Cardiovascular: No chest pain. Respiratory: No SOB. Gastrointestinal: No abdominal pain.  No nausea, no vomiting.  Musculoskeletal: Positive for ankle pain. Skin: Negative  for rash, abrasions, lacerations, ecchymosis. Neurological: Negative for numbness or tingling   ____________________________________________   PHYSICAL EXAM:  VITAL SIGNS: ED Triage Vitals  Enc Vitals Group     BP 10/20/17 1411 131/82     Pulse Rate 10/20/17 1411 63     Resp 10/20/17 1411 18     Temp 10/20/17 1411 98.5 F (36.9 C)     Temp Source 10/20/17 1411 Oral     SpO2 10/20/17 1411 100 %     Weight --      Height --      Head Circumference --      Peak Flow --      Pain Score 10/20/17 1420 7      Pain Loc --      Pain Edu? --      Excl. in [place]? --      Constitutional: Alert and oriented. Well appearing and in no acute distress. Eyes: Conjunctivae are normal. PERRL. EOMI. Head: Atraumatic. ENT:      Ears:      Nose: No congestion/rhinnorhea.      Mouth/Throat: Mucous membranes are moist.  Neck: No stridor.  No cervical spine tenderness to palpation. Cardiovascular: Normal rate, regular rhythm.  Good peripheral circulation. Respiratory: Normal respiratory effort without tachypnea or retractions. Lungs CTAB. Good air entry to the bases with no decreased or absent breath sounds. Gastrointestinal: Bowel sounds 4 quadrants. Soft and nontender to palpation. No guarding or rigidity. No palpable masses. No distention.  Musculoskeletal: Full range of motion to all extremities. No gross deformities appreciated.  Full range of motion of ankle.  Achilles tendon intact.  Minimal tenderness to palpation over posterior ankle. Neurologic:  Normal speech and language. No gross focal neurologic deficits are appreciated.  Skin:  Skin is warm, dry and intact. No rash noted. Psychiatric: Mood and affect are normal. Speech and behavior are normal. Patient exhibits appropriate insight and judgement.   ____________________________________________   LABS (all labs ordered are listed, but only abnormal results are displayed)  Labs Reviewed - No data to display ____________________________________________  EKG   ____________________________________________  RADIOLOGY Robinette Haines, personally viewed and evaluated these images (plain radiographs) as part of my medical decision making, as well as reviewing the written report by the radiologist.  Ct Head Wo Contrast  Result Date: 10/20/2017 CLINICAL DATA:  Fall with head trauma. EXAM: CT HEAD WITHOUT CONTRAST CT CERVICAL SPINE WITHOUT CONTRAST TECHNIQUE: Multidetector CT imaging of the head and cervical spine was performed following the  standard protocol without intravenous contrast. Multiplanar CT image reconstructions of the cervical spine were also generated. COMPARISON:  Head CT 09/22/2015 FINDINGS: CT HEAD FINDINGS Brain: There is no mass, hemorrhage or extra-axial collection. The size and configuration of the ventricles and extra-axial CSF spaces are normal. There is no acute or chronic infarction. The brain parenchyma is normal. Vascular: No abnormal hyperdensity of the major intracranial arteries or dural venous sinuses. No intracranial atherosclerosis. Skull: The visualized skull base, calvarium and extracranial soft tissues are normal. Sinuses/Orbits: No fluid levels or advanced mucosal thickening of the visualized paranasal sinuses. No mastoid or middle ear effusion. The orbits are normal. CT CERVICAL SPINE FINDINGS Alignment: No static subluxation. Facets are aligned. Occipital condyles are normally positioned. Skull base and vertebrae: No acute fracture. Soft tissues and spinal canal: No prevertebral fluid or swelling. No visible canal hematoma. Disc levels: Bilateral uncovertebral hypertrophy at C4-5 contributes to moderate bilateral foraminal stenosis. Right-greater-than-left uncovertebral hypertrophy at C5-6 causes moderate  right foraminal stenosis. Upper chest: No pneumothorax, pulmonary nodule or pleural effusion. Other: Normal visualized paraspinal cervical soft tissues. IMPRESSION: 1. Normal head CT. 2. No acute fracture or static subluxation of the cervical spine. Electronically Signed   By: Ulyses Jarred M.D.   On: 10/20/2017 15:22   Ct Cervical Spine Wo Contrast  Result Date: 10/20/2017 CLINICAL DATA:  Fall with head trauma. EXAM: CT HEAD WITHOUT CONTRAST CT CERVICAL SPINE WITHOUT CONTRAST TECHNIQUE: Multidetector CT imaging of the head and cervical spine was performed following the standard protocol without intravenous contrast. Multiplanar CT image reconstructions of the cervical spine were also generated. COMPARISON:   Head CT 09/22/2015 FINDINGS: CT HEAD FINDINGS Brain: There is no mass, hemorrhage or extra-axial collection. The size and configuration of the ventricles and extra-axial CSF spaces are normal. There is no acute or chronic infarction. The brain parenchyma is normal. Vascular: No abnormal hyperdensity of the major intracranial arteries or dural venous sinuses. No intracranial atherosclerosis. Skull: The visualized skull base, calvarium and extracranial soft tissues are normal. Sinuses/Orbits: No fluid levels or advanced mucosal thickening of the visualized paranasal sinuses. No mastoid or middle ear effusion. The orbits are normal. CT CERVICAL SPINE FINDINGS Alignment: No static subluxation. Facets are aligned. Occipital condyles are normally positioned. Skull base and vertebrae: No acute fracture. Soft tissues and spinal canal: No prevertebral fluid or swelling. No visible canal hematoma. Disc levels: Bilateral uncovertebral hypertrophy at C4-5 contributes to moderate bilateral foraminal stenosis. Right-greater-than-left uncovertebral hypertrophy at C5-6 causes moderate right foraminal stenosis. Upper chest: No pneumothorax, pulmonary nodule or pleural effusion. Other: Normal visualized paraspinal cervical soft tissues. IMPRESSION: 1. Normal head CT. 2. No acute fracture or static subluxation of the cervical spine. Electronically Signed   By: Ulyses Jarred M.D.   On: 10/20/2017 15:22    ____________________________________________    PROCEDURES  Procedure(s) performed:    Procedures    Medications - No data to display   ____________________________________________   INITIAL IMPRESSION / ASSESSMENT AND PLAN / ED COURSE  Pertinent labs & imaging results that were available during my care of the patient were reviewed by me and considered in my medical decision making (see chart for details).  Review of the [place] CSRS was performed in accordance of the [place] prior to dispensing any controlled  drugs.     Patient presented to the emergency department for evaluation after head injury.  Vital signs and exam are reassuring.  CT head and cervical are negative for acute abnormalities.  Symptoms are consistent with concussion.  Education was provided.  Patient will be discharged home with prescriptions for ibuprofen and tramadol. Patient is to follow up with primary care as directed. Patient is given ED precautions to return to the ED for any worsening or new symptoms.     ____________________________________________  FINAL CLINICAL IMPRESSION(S) / ED DIAGNOSES  Final diagnoses:  Injury of head, initial encounter      NEW MEDICATIONS STARTED DURING THIS VISIT:  ED Discharge Orders         Ordered    ibuprofen (ADVIL,MOTRIN) 600 MG tablet  Every 6 hours PRN     10/20/17 1541    traMADol (ULTRAM) 50 MG tablet  Every 6 hours PRN     10/20/17 1541              This chart was dictated using voice recognition software/Dragon. Despite best efforts to proofread, errors can occur which can change the meaning. Any change was purely unintentional.  Laban Emperor, PA-C 10/21/17 2458    Lavonia Drafts, MD 10/23/17 1000

## 2018-10-15 ENCOUNTER — Ambulatory Visit
Admission: RE | Admit: 2018-10-15 | Discharge: 2018-10-15 | Disposition: A | Discharge status: Home / Self Care | Payer: PRIVATE HEALTH INSURANCE | Source: Ambulatory Visit | Attending: Family Medicine | Admitting: Family Medicine

## 2018-10-15 ENCOUNTER — Other Ambulatory Visit: Payer: Self-pay | Admitting: Family Medicine

## 2018-10-15 DIAGNOSIS — W19XXXA Unspecified fall, initial encounter: Secondary | ICD-10-CM

## 2018-10-15 DIAGNOSIS — M79671 Pain in right foot: Secondary | ICD-10-CM | POA: Insufficient documentation

## 2020-02-11 IMAGING — CT CT CERVICAL SPINE W/O CM
4 of 7 series · 14 of 33 positions shown, 15 images · non-contrast
Comparison: Head CT 09/22/2015

CLINICAL DATA: Fall with head trauma.

EXAM:
CT HEAD WITHOUT CONTRAST
CT CERVICAL SPINE WITHOUT CONTRAST
TECHNIQUE: Multidetector CT imaging of the head and cervical spine was
performed following the standard protocol without intravenous
contrast. Multiplanar CT image reconstructions of the cervical spine
were also generated.

[Series 7: c spine soft · axial · 0.29mm/px · z∈[+229,+347]mm · 4 of 99 slices shown]
[im 20/99  soft-tissue]
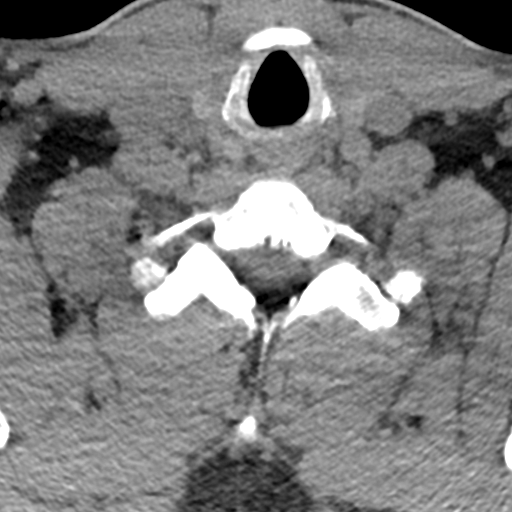
[im 40/99  soft-tissue]
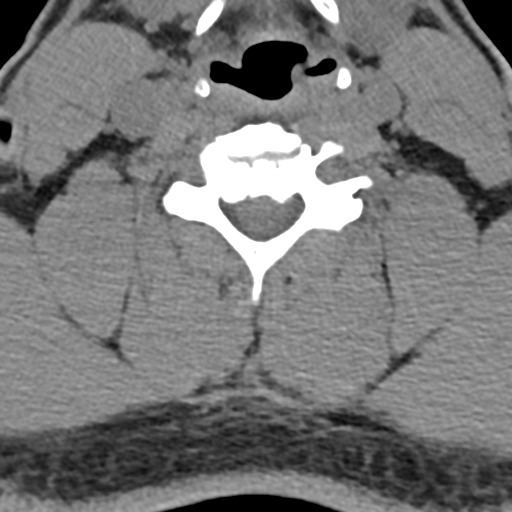
[im 59/99  soft-tissue]
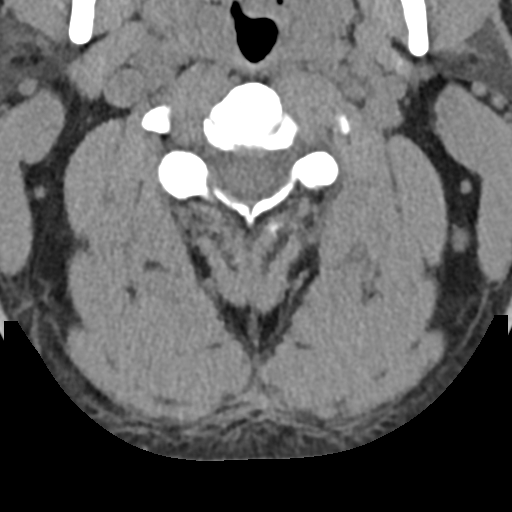
[im 79/99  soft-tissue]
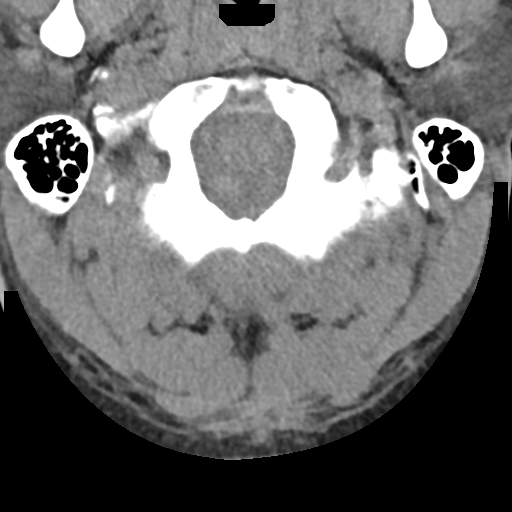

[Series 8: sagittal bone · sagittal · 0.29mm/px · 5 of 65 slices shown]
[im 11/65  bone]
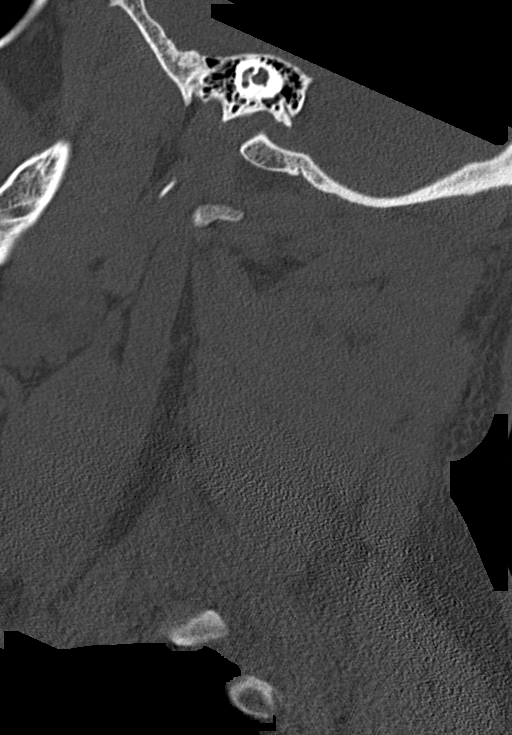
[im 22/65  bone]
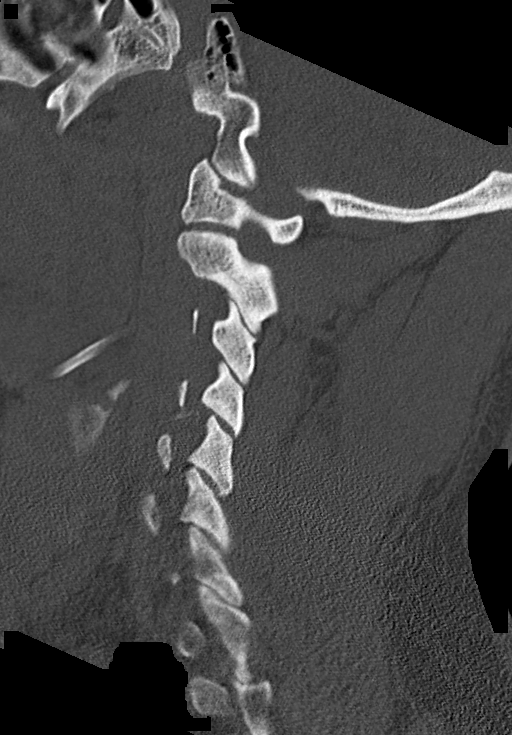
[im 33/65  bone]
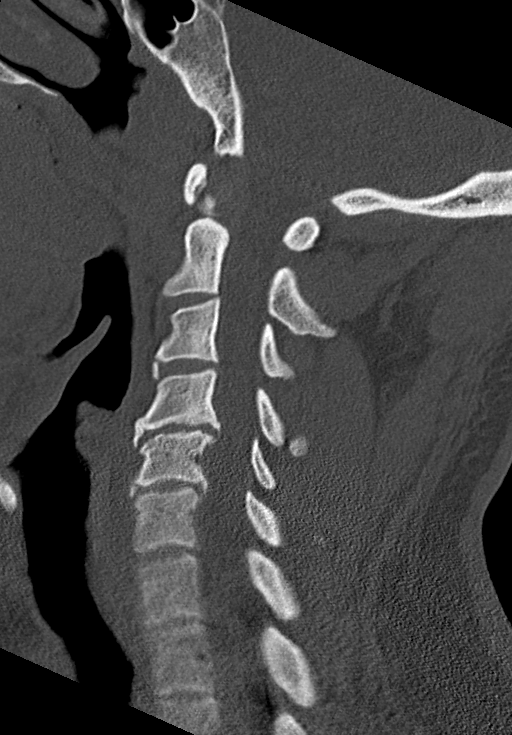
[im 43/65  bone]
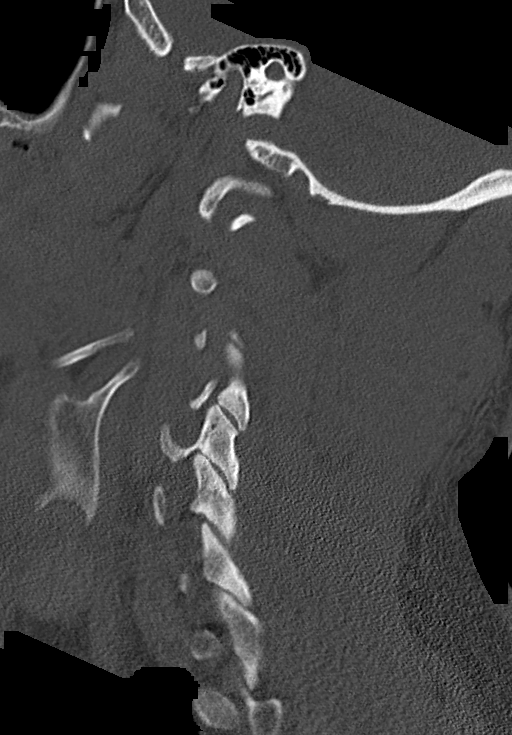
[im 54/65  bone]
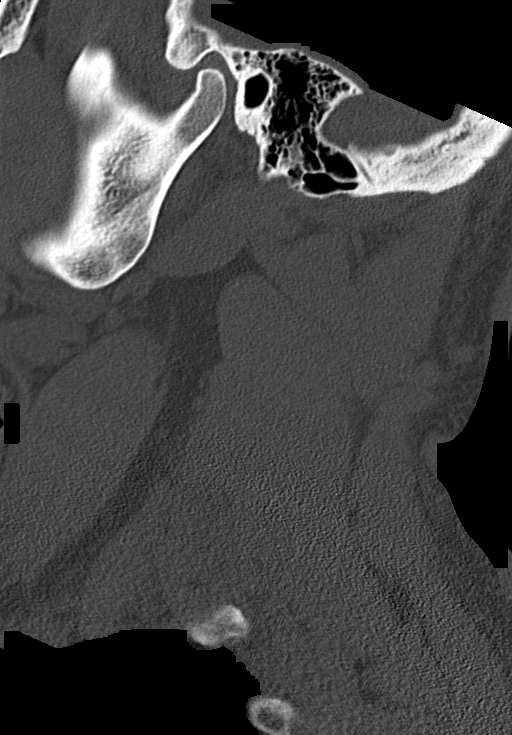

[Series 9: coronal bone · coronal · 0.26mm/px · 1 of 55 slices shown]
[im 28/55  bone]
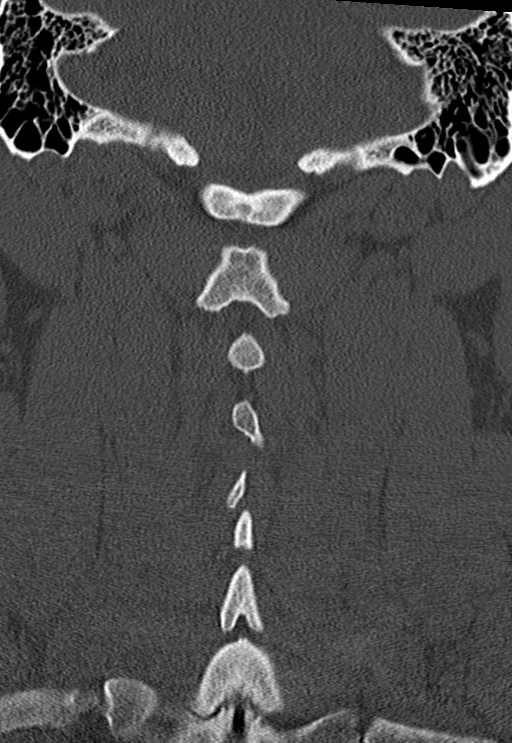

[Series 10: orthogonal bone · axial · 0.23mm/px · z∈[+211,+316]mm · 4 of 98 slices shown, 5 images]
[im 20/98  soft-tissue]
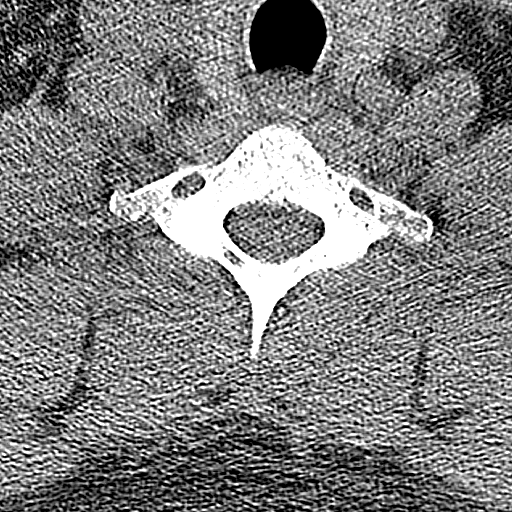
[im 20/98  bone]
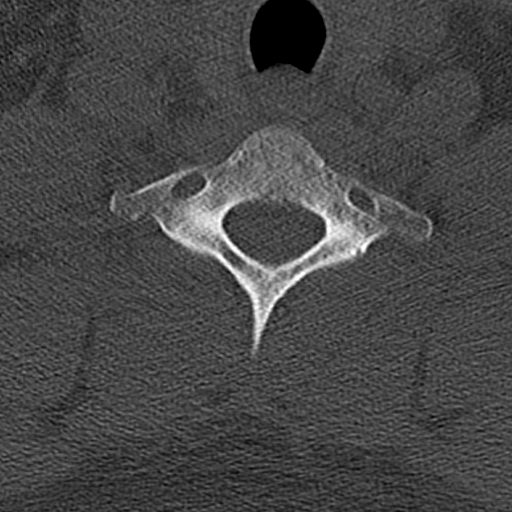
[im 39/98  bone]
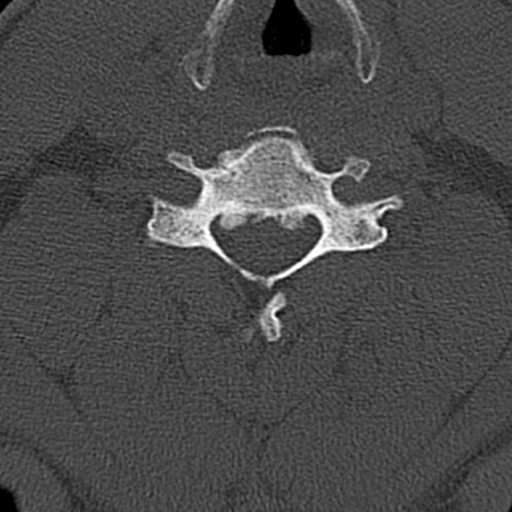
[im 59/98  bone]
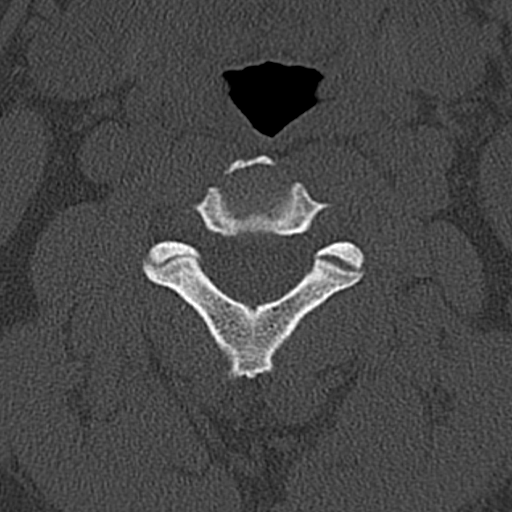
[im 78/98  bone]
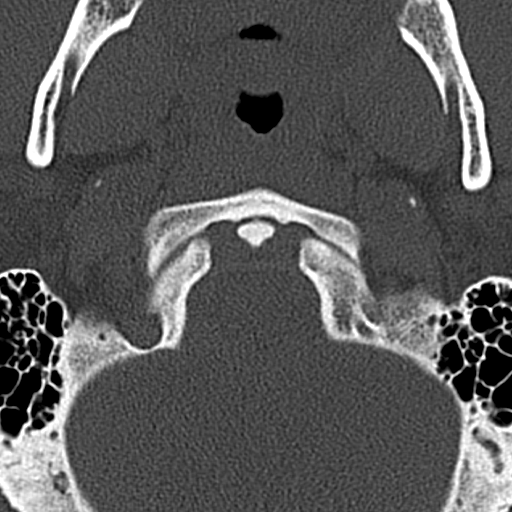

[14 of 33 positions shown; findings below may reference images not displayed]

FINDINGS: CT HEAD FINDINGS

Brain: There is no mass, hemorrhage or extra-axial collection. The
size and configuration of the ventricles and extra-axial CSF spaces
are normal. There is no acute or chronic infarction. The brain
parenchyma is normal.

Vascular: No abnormal hyperdensity of the major intracranial
arteries or dural venous sinuses. No intracranial atherosclerosis.

Skull: The visualized skull base, calvarium and extracranial soft
tissues are normal.

Sinuses/Orbits: No fluid levels or advanced mucosal thickening of
the visualized paranasal sinuses. No mastoid or middle ear effusion.
The orbits are normal.

CT CERVICAL SPINE FINDINGS

Alignment: No static subluxation. Facets are aligned. Occipital
condyles are normally positioned.

Skull base and vertebrae: No acute fracture.

Soft tissues and spinal canal: No prevertebral fluid or swelling. No
visible canal hematoma.

Disc levels: Bilateral uncovertebral hypertrophy at C4-5 contributes
to moderate bilateral foraminal stenosis. Right-greater-than-left
uncovertebral hypertrophy at C5-6 causes moderate right foraminal
stenosis.

Upper chest: No pneumothorax, pulmonary nodule or pleural effusion.

Other: Normal visualized paraspinal cervical soft tissues.
IMPRESSION: 1. Normal head CT.
2. No acute fracture or static subluxation of the cervical spine.

## 2021-02-05 IMAGING — CR DG FOOT COMPLETE 3+V*R*
1 series · 3 of 3 positions shown · non-contrast
Comparison: None.

CLINICAL DATA: Pain following fall

EXAM:
RIGHT FOOT COMPLETE - 3+ VIEW

[Series 1: dg foot complete right · 0.14mm/px · 3 of 3 slices shown]
[im 1/3]
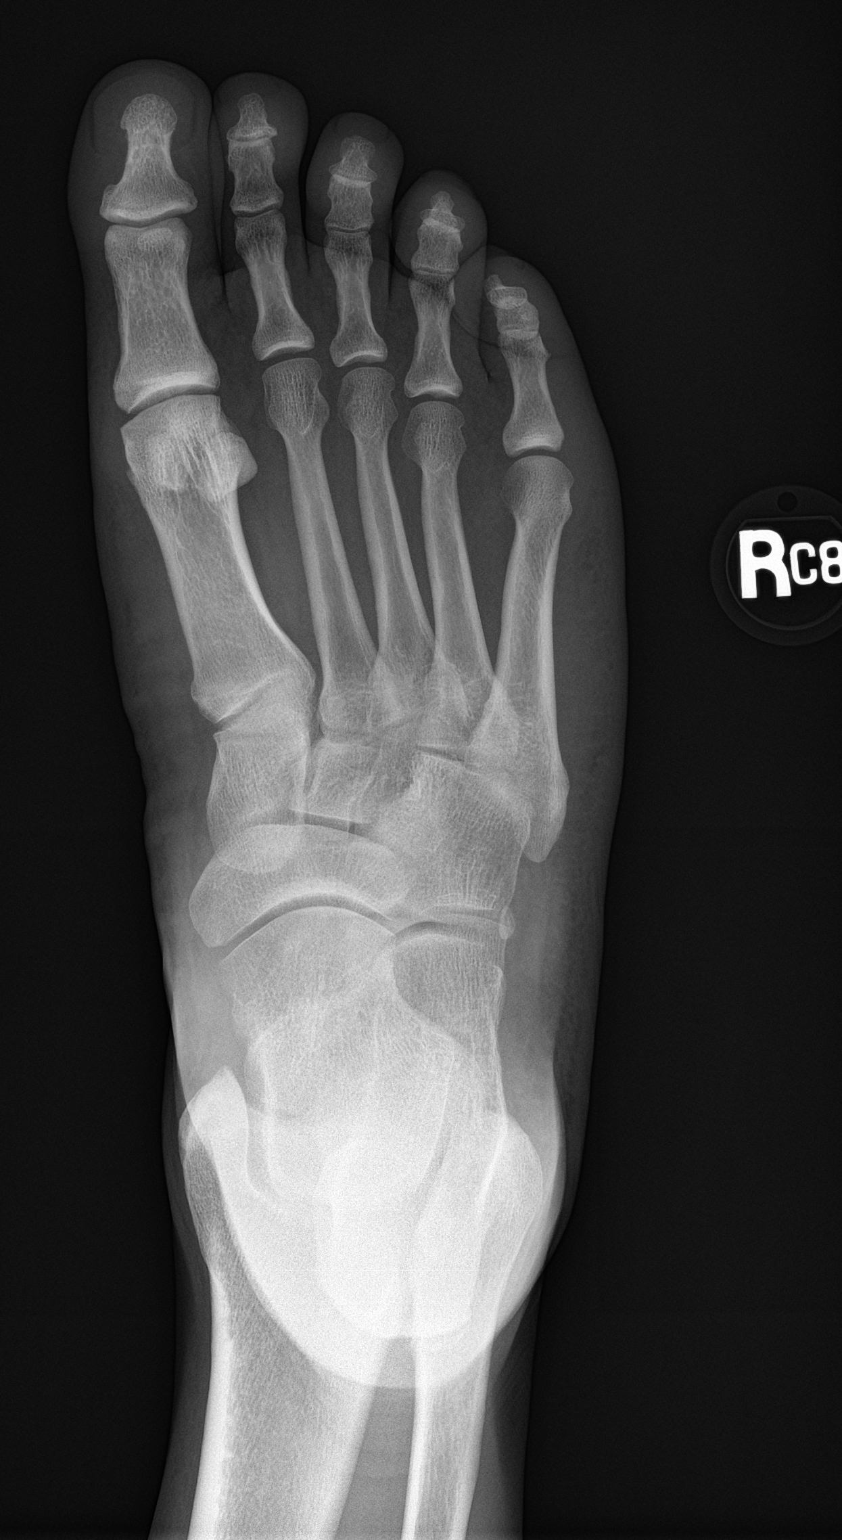
[im 2/3]
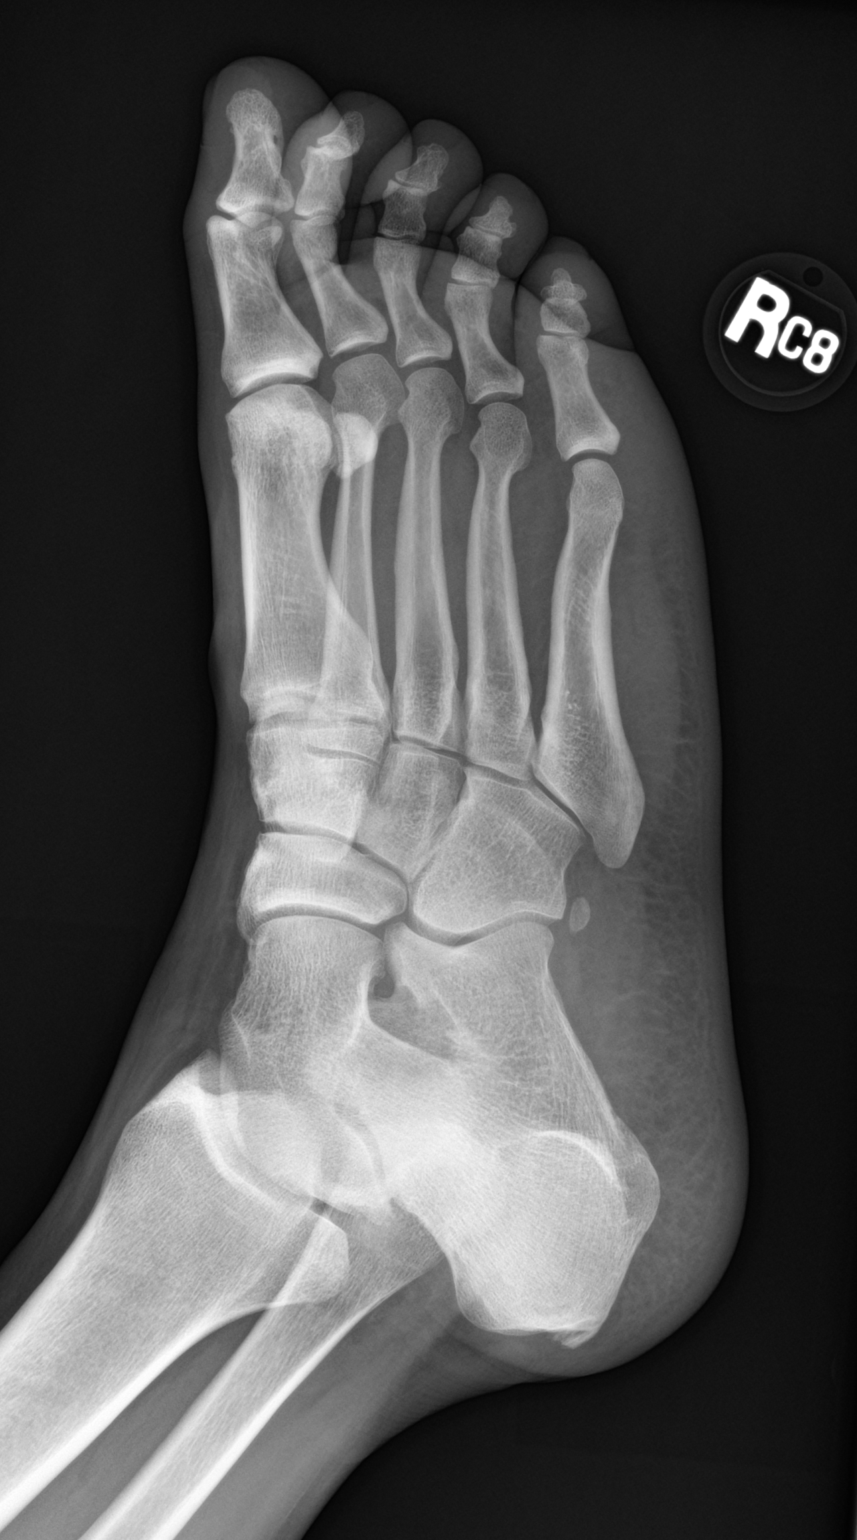
[im 3/3]
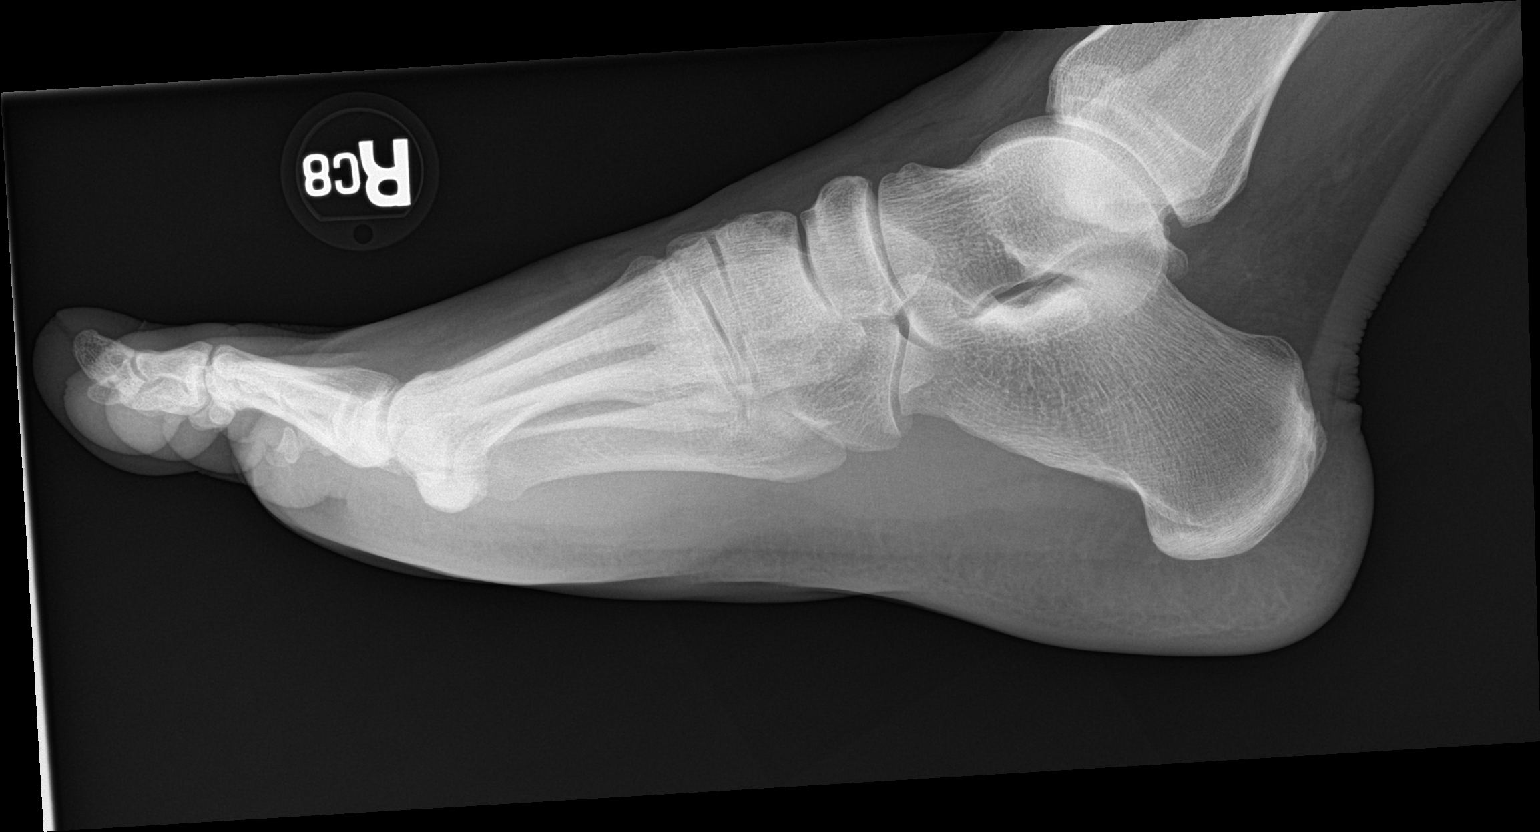

[3 of 3 positions shown; findings below may reference images not displayed]

FINDINGS: Frontal, oblique, and lateral views were obtained. No fracture or
dislocation. Joint spaces appear normal. No erosive change.
IMPRESSION: No fracture or dislocation.  No evident arthropathy.

## 2021-07-28 ENCOUNTER — Emergency Department (HOSPITAL_COMMUNITY): Payer: BC Managed Care – PPO

## 2021-07-28 ENCOUNTER — Emergency Department (HOSPITAL_COMMUNITY)
Admission: EM | Admit: 2021-07-28 | Discharge: 2021-07-28 | Disposition: A | Discharge status: Home / Self Care | Payer: BC Managed Care – PPO | Attending: Student | Admitting: Student

## 2021-07-28 ENCOUNTER — Other Ambulatory Visit: Payer: Self-pay

## 2021-07-28 ENCOUNTER — Encounter (HOSPITAL_COMMUNITY): Payer: Self-pay | Admitting: *Deleted

## 2021-07-28 DIAGNOSIS — R42 Dizziness and giddiness: Secondary | ICD-10-CM | POA: Diagnosis not present

## 2021-07-28 DIAGNOSIS — R112 Nausea with vomiting, unspecified: Secondary | ICD-10-CM | POA: Diagnosis present

## 2021-07-28 DIAGNOSIS — R519 Headache, unspecified: Secondary | ICD-10-CM | POA: Insufficient documentation

## 2021-07-28 DIAGNOSIS — R110 Nausea: Secondary | ICD-10-CM

## 2021-07-28 LAB — URINALYSIS, ROUTINE W REFLEX MICROSCOPIC
Bacteria, UA: NONE SEEN
Bilirubin Urine: NEGATIVE
Glucose, UA: NEGATIVE mg/dL
Ketones, ur: 20 mg/dL — AB
Leukocytes,Ua: NEGATIVE
Nitrite: NEGATIVE
Protein, ur: NEGATIVE mg/dL
Specific Gravity, Urine: 1.025 (ref 1.005–1.030)
pH: 6 (ref 5.0–8.0)

## 2021-07-28 LAB — COMPREHENSIVE METABOLIC PANEL
ALT: 20 U/L (ref 0–44)
AST: 24 U/L (ref 15–41)
Albumin: 4.1 g/dL (ref 3.5–5.0)
Alkaline Phosphatase: 59 U/L (ref 38–126)
Anion gap: 10 (ref 5–15)
BUN: 6 mg/dL (ref 6–20)
CO2: 26 mmol/L (ref 22–32)
Calcium: 8.8 mg/dL — ABNORMAL LOW (ref 8.9–10.3)
Chloride: 100 mmol/L (ref 98–111)
Creatinine, Ser: 1.24 mg/dL (ref 0.61–1.24)
GFR, Estimated: >60 mL/min (ref >60)
Glucose, Bld: 108 mg/dL — ABNORMAL HIGH (ref 70–99)
Potassium: 3.3 mmol/L — ABNORMAL LOW (ref 3.5–5.1)
Sodium: 136 mmol/L (ref 135–145)
Total Bilirubin: 0.9 mg/dL (ref 0.3–1.2)
Total Protein: 7.6 g/dL (ref 6.5–8.1)

## 2021-07-28 LAB — CBC
HCT: 49.3 % (ref 39.0–52.0)
Hemoglobin: 15 g/dL (ref 13.0–17.0)
MCH: 22.3 pg — ABNORMAL LOW (ref 26.0–34.0)
MCHC: 30.4 g/dL (ref 30.0–36.0)
MCV: 73.4 fL — ABNORMAL LOW (ref 80.0–100.0)
Platelets: 344 10*3/uL (ref 150–400)
RBC: 6.72 MIL/uL — ABNORMAL HIGH (ref 4.22–5.81)
RDW: 15.7 % — ABNORMAL HIGH (ref 11.5–15.5)
WBC: 7.1 10*3/uL (ref 4.0–10.5)
nRBC: 0 % (ref 0.0–0.2)

## 2021-07-28 LAB — LIPASE, BLOOD: Lipase: 43 U/L (ref 11–51)

## 2021-07-28 MED ORDER — MECLIZINE HCL 25 MG PO TABS: 25.0000 mg | ORAL_TABLET | Freq: Three times a day (TID) | ORAL | 0 refills | Status: AC | PRN

## 2021-07-28 MED ORDER — ONDANSETRON 4 MG PO TBDP
8.0000 mg | ORAL_TABLET | Freq: Once | ORAL | Status: AC
  Administered 2021-07-28: 8 mg via ORAL
  Filled 2021-07-28: qty 2

## 2021-07-28 MED ORDER — DIPHENHYDRAMINE HCL 25 MG PO CAPS
25.0000 mg | ORAL_CAPSULE | Freq: Once | ORAL | Status: AC
  Administered 2021-07-28: 25 mg via ORAL
  Filled 2021-07-28: qty 1

## 2021-07-28 MED ORDER — ONDANSETRON HCL 4 MG PO TABS: 4.0000 mg | ORAL_TABLET | Freq: Four times a day (QID) | ORAL | 0 refills | Status: AC

## 2021-07-28 MED ORDER — PROCHLORPERAZINE MALEATE 5 MG PO TABS
10.0000 mg | ORAL_TABLET | Freq: Once | ORAL | Status: AC
  Administered 2021-07-28: 10 mg via ORAL
  Filled 2021-07-28: qty 2

## 2021-07-28 NOTE — ED Provider Notes (Signed)
Accepted handoff at shift change from [place], [place]. Please see prior provider note for more detail.   Briefly: Patient is 46 y.o. male with a PMH of asthma and a recent hemorrhoidectomy 1.5 weeks ago presents with nausea and vomiting since the surgery on Sunday. He has had intermittent nausea, without stomach pains, no hematemesis or coffee-ground emesis. He is still passing gas, but has not had a good bowel movement. He has no other significant abdominal history. denies history of NSAID use, alcohol, hx of diverticulitis, hx of bowel obstruction.  States that he has had subjective chills without fevers, he states he has been taking his Zofran with some relief.  He has no other complaints. He was seen at [facility] ED on June 27th, for the same presentation, and lab work was unremarkable. CT a/p also unremarkable.   Work up has been very reassuring here. He has a K of 3.3, no leukocytosis or anemia, kidney and liver function okay. Lipase normal. Urine with 20 ketones, small hgb, but otherwise unremarkable. Acute abdomen xray with nonobstructive gas pattern.   During ED course, on his way to x ray, he states that he felt a little dizzy like he was on a roller coaster. He also had a new 5/10 headache, but this is very different from prior headaches. His neuro exam is completely normal. CT head was ordered.  DDX: concern for primary headache, r/o mass effect  Plan: f/u on head CT results. If normal, discharge home.   Physical Exam  BP 131/62   Pulse 78   Temp 99.3 F (37.4 C) (Oral)   Resp 16   Ht '5\' 10"'$  (1.778 m)   Wt 104.3 kg   SpO2 96%   BMI 32.99 kg/m   Physical Exam  Procedures  Procedures  ED Course / MDM   Clinical Course as of 07/28/21 0647  Sun Jul 28, 2021  0634 N/v for 1 week.  [GL]    Clinical Course User Index [GL] Sherre Poot Adora Fridge, PA-C   Medical Decision Making Amount and/or Complexity of Data Reviewed Radiology: ordered.  Risk Prescription drug  management.   CT head reviewed and no evidence of intracranial hemorrhage or mass occupying lesion. No other acute findings. Headache improved. Labs reviewed and no concerning findings. Patient is stable for discharge. He should have outpatient follow up with neurology and GI.        Adolphus Birchwood, PA-C 07/28/21 0715    Sherwood Gambler, MD 07/28/21 [phone]

## 2021-07-28 NOTE — ED Triage Notes (Signed)
Thew pt hAD a hemorrhoidectomy one and one.half weeks ago  since then he has had nausea and vomiting

## 2021-07-28 NOTE — ED Provider Notes (Signed)
[place] [facility] EMERGENCY DEPARTMENT Provider Note   CSN: [id number] Arrival date & time: 07/28/21  0128     History  Chief Complaint  Patient presents with   Abdominal Pain    Derek Bush is a 46 y.o. male.  HPI  History of status post hemorrhoidectomy presents with complaints of nausea and vomiting, he has been like this since his surgery last Sunday, states that he has intermittent nausea, without stomach pains, no hematemesis or coffee-ground emesis states she still passing gas has not had a good bowel movement, has no other significant abdominal history denies history of NSAID use alcohol, diverticulitis, bowel obstruction.  States that he has had subjective chills without fevers, he states he has been taking his Zofran with some relief.  He has no other complaints.  Review patient's chart was seen at [facility] emergency department on the 27th, for same presentation, lab work was unremarkable, CT on pelvis was also unremarkable, and he was later discharged home.  Home Medications Prior to Admission medications   Medication Sig Start Date End Date Taking? Authorizing Provider  meclizine (ANTIVERT) 25 MG tablet Take 1 tablet (25 mg total) by mouth 3 (three) times daily as needed for dizziness. 07/28/21  Yes Marcello Fennel, PA-C  ondansetron (ZOFRAN) 4 MG tablet Take 1 tablet (4 mg total) by mouth every 6 (six) hours. 07/28/21  Yes Marcello Fennel, PA-C  acetaminophen (TYLENOL) 500 MG tablet Take 1 tablet (500 mg total) by mouth every 6 (six) hours as needed. Patient not taking: Reported on 09/22/2015 12/06/14   Gloriann Loan, PA-C  albuterol (PROVENTIL HFA;VENTOLIN HFA) 108 [phone] Base) MCG/ACT inhaler Inhale 1-2 puffs into the lungs every 6 (six) hours as needed for wheezing. 02/18/17   Larene Pickett, PA-C  azithromycin (ZITHROMAX) 250 MG tablet Take 1 tablet (250 mg total) by mouth daily. Take first 2 tablets together, then 1 every day until finished. 02/18/17    Larene Pickett, PA-C  benzonatate (TESSALON) 100 MG capsule Take 1 capsule (100 mg total) by mouth every 8 (eight) hours. 02/18/17   Larene Pickett, PA-C  docusate sodium (COLACE) 100 MG capsule Take 1 capsule (100 mg total) by mouth every 12 (twelve) hours. Patient not taking: Reported on 09/22/2015 07/25/12   Antonietta Breach, PA-C  HYDROcodone-acetaminophen (NORCO/VICODIN) 5-325 MG tablet Take 1-2 tablets by mouth every 6 (six) hours as needed. 06/12/16   Montine Circle, PA-C  ibuprofen (ADVIL,MOTRIN) 600 MG tablet Take 1 tablet (600 mg total) by mouth every 6 (six) hours as needed. 10/20/17   Laban Emperor, PA-C  Lidocaine, Anorectal, 5 % GEL Apply 15 mLs topically every 4 (four) hours as needed (pain). Patient not taking: Reported on 09/22/2015 07/25/12   Antonietta Breach, PA-C  predniSONE (STERAPRED UNI-PAK 21 TAB) 10 MG (21) TBPK tablet Use as directed 09/28/17   Margarita Mail, PA-C      Allergies    Patient has no known allergies.    Review of Systems   Review of Systems  Constitutional:  Negative for chills and fever.  Respiratory:  Negative for shortness of breath.   Cardiovascular:  Negative for chest pain.  Gastrointestinal:  Positive for nausea and vomiting. Negative for abdominal pain.  Neurological:  Negative for headaches.    Physical Exam Updated Vital Signs BP 131/62   Pulse 78   Temp 99.3 F (37.4 C) (Oral)   Resp 16   Ht '5\' 10"'  (1.778 m)   Wt 104.3 kg  SpO2 96%   BMI 32.99 kg/m  Physical Exam Vitals and nursing note reviewed.  Constitutional:      General: He is not in acute distress.    Appearance: He is not ill-appearing.  HENT:     Head: Normocephalic and atraumatic.     Nose: No congestion.  Eyes:     Conjunctiva/sclera: Conjunctivae normal.  Cardiovascular:     Rate and Rhythm: Normal rate and regular rhythm.     Pulses: Normal pulses.     Heart sounds: No murmur heard.    No friction rub. No gallop.  Pulmonary:     Effort: No respiratory distress.      Breath sounds: No wheezing, rhonchi or rales.  Abdominal:     Palpations: Abdomen is soft.     Tenderness: There is no abdominal tenderness. There is no right CVA tenderness or left CVA tenderness.  Skin:    General: Skin is warm and dry.  Neurological:     Mental Status: He is alert.     Cranial Nerves: No cranial nerve deficit.     Motor: No weakness.     Gait: Gait is intact.     Comments: No facial asymmetry no difficulty with word finding following two-step commands no regular weakness present.  Psychiatric:        Mood and Affect: Mood normal.     ED Results / Procedures / Treatments   Labs (all labs ordered are listed, but only abnormal results are displayed) Labs Reviewed  COMPREHENSIVE METABOLIC PANEL - Abnormal; Notable for the following components:      Result Value   Potassium 3.3 (*)    Glucose, Bld 108 (*)    Calcium 8.8 (*)    All other components within normal limits  CBC - Abnormal; Notable for the following components:   RBC 6.72 (*)    MCV 73.4 (*)    MCH 22.3 (*)    RDW 15.7 (*)    All other components within normal limits  URINALYSIS, ROUTINE W REFLEX MICROSCOPIC - Abnormal; Notable for the following components:   Hgb urine dipstick SMALL (*)    Ketones, ur 20 (*)    All other components within normal limits  LIPASE, BLOOD    EKG None  Radiology DG Abdomen Acute W/Chest  Result Date: 07/28/2021 CLINICAL DATA:  46 year old male with history of nausea and headache for the past 2 weeks. Evaluate for bowel obstruction. EXAM: DG ABDOMEN ACUTE WITH 1 VIEW CHEST COMPARISON:  Chest x-ray 09/28/2017. FINDINGS: Lung volumes are normal. No consolidative airspace disease. No pleural effusions. No pneumothorax. No pulmonary nodule or mass noted. Pulmonary vasculature and the cardiomediastinal silhouette are within normal limits. Gas and stool are seen scattered throughout the colon extending to the level of the distal rectum. No pathologic distension of small  bowel is noted. No gross evidence of pneumoperitoneum. IMPRESSION: 1. Nonobstructive bowel gas pattern. 2. No pneumoperitoneum. 3. No radiographic evidence of acute cardiopulmonary disease. Electronically Signed   By: Vinnie Langton M.D.   On: 07/28/2021 06:31    Procedures Procedures    Medications Ordered in ED Medications  ondansetron (ZOFRAN-ODT) disintegrating tablet 8 mg (8 mg Oral Given 07/28/21 0545)  prochlorperazine (COMPAZINE) tablet 10 mg (10 mg Oral Given 07/28/21 0650)  diphenhydrAMINE (BENADRYL) capsule 25 mg (25 mg Oral Given 07/28/21 0650)    ED Course/ Medical Decision Making/ A&P Clinical Course as of 07/28/21 0651  Sun Jul 28, 2021  0634 N/v for 1  week.  [GL]    Clinical Course User Index [GL] Sherre Poot Adora Fridge, PA-C                           Medical Decision Making Amount and/or Complexity of Data Reviewed Radiology: ordered.  Risk Prescription drug management.   This patient presents to the ED for concern of nausea, this involves an extensive number of treatment options, and is a complaint that carries with it a high risk of complications and morbidity.  The differential diagnosis includes bowel obstruction, volvulus, electrolyte derailments    Additional history obtained:  Additional history obtained from partner at bedside External records from outside source obtained and reviewed including previous ED note   Co morbidities that complicate the patient evaluation  N/A  Social Determinants of Health:  N/A  Lab Tests:  I Ordered, and personally interpreted labs.  The pertinent results include: CBC unremarkable, CMP shows potassium 3.3 glucose 108 calcium 8.8, lipase 43, UA is unremarkable   Imaging Studies ordered:  I ordered imaging studies including acute chest abdomen I independently visualized and interpreted imaging which showed negative acute findings I agree with the radiologist interpretation   Cardiac Monitoring:  The patient was  maintained on a cardiac monitor.  I personally viewed and interpreted the cardiac monitored which showed an underlying rhythm of: N/A   Medicines ordered and prescription drug management:  I ordered medication including Zofran I have reviewed the patients home medicines and have made adjustments as needed  Critical Interventions:  N/A   Reevaluation:  Presents with nausea, states he does not feel nauseous at the moment, will provide him with Zofran and then p.o. challenge and reassess.  Reassessment patient is tolerating p.o., no vomiting, he states that on his way to x-ray he felt a little dizzy, describes as being on a roller coaster, and states he has a slight headache, patient endorses that the headache and the dizziness just started here while he was going to the x-ray, denies any change in vision paresthesias or weakness of the upper or lower extremities, he had no focal deficit present on my exam, he is ambulating without difficulty no truncal instability  With ambulation he also notes that does not feel dizzy while standing or ambulating currently,  just felt a little nauseous, since patient is endorsing another headache, this is different from his previous headaches, will obtain CT head for rule out of mass, will also further migraine cocktail.    Consultations Obtained:  N/A    Test Considered:  CT and pelvis-deferred to my suspicion for intra-abdominal abnormality low at this time nonsurgical abdomen.    Rule out low suspicion for lower lobe pneumonia as lung sounds are clear bilaterally, will defer imaging at this time.  I have low suspicion for liver or gallbladder abnormality as she has no right upper quadrant tenderness, liver enzymes, alk phos, T bili all within normal limits.  Low suspicion for pancreatitis as lipase is within normal limits.  Low suspicion for ruptured stomach ulcer as she has no peritoneal sign present on exam.  Low suspicion for bowel obstruction  as abdomen is nondistended normal bowel sounds, still passing gas.  Low suspicion for complicated diverticulitis as she is nontoxic-appearing, vital signs reassuring no leukocytosis present.  Low suspicion for appendicitis as she has no right lower quadrant tenderness, vital signs reassuring.  I have low suspicion for intra-abdominal infection as she has low risk factors, vital signs  reassuring, no leukocytosis, will defer imaging at this time.  Low suspicion for CVA or internal head bleed not on anticoag's no recent head trauma no focal deficit present my exam.     Dispostion and problem list  Due to shift change patient may handoff to Paulita Cradle PA-C  Follow-up on CT head if unremarkable,  headache has resolved, discharged home recommend provide him with meclizine for likely vertigo, and provide him with Zofran for nausea follow-up with GI for further evaluation.             Final Clinical Impression(s) / ED Diagnoses Final diagnoses:  Nausea    Rx / DC Orders ED Discharge Orders          Ordered    ondansetron (ZOFRAN) 4 MG tablet  Every 6 hours        07/28/21 0608    meclizine (ANTIVERT) 25 MG tablet  3 times daily PRN        07/28/21 0640              Marcello Fennel, PA-C 07/28/21 8412    Teressa Lower, MD 07/29/21 [phone]

## 2021-07-28 NOTE — Discharge Instructions (Addendum)
Continue Zofran for nausea take as prescribed, recommend a bland diet.  Regards to your dizziness have given you meclizine please take as prescribed May follow-up with neurology as needed.  Please follow GI for further evaluation  Come back to the emergency department if you develop chest pain, shortness of breath, severe abdominal pain, uncontrolled nausea, vomiting, diarrhea.
